# Patient Record
Sex: Male | Born: 1970 | Race: White | Hispanic: No | Smoking: Current every day smoker
Health system: Southern US, Community
[De-identification: ages and names within clinical notes are randomized; demographics above are authoritative.]

## PROBLEM LIST (undated history)

## (undated) DIAGNOSIS — K529 Noninfective gastroenteritis and colitis, unspecified: Secondary | ICD-10-CM

## (undated) DIAGNOSIS — K219 Gastro-esophageal reflux disease without esophagitis: Secondary | ICD-10-CM

## (undated) DIAGNOSIS — I10 Essential (primary) hypertension: Secondary | ICD-10-CM

## (undated) DIAGNOSIS — T7840XA Allergy, unspecified, initial encounter: Secondary | ICD-10-CM

## (undated) DIAGNOSIS — N200 Calculus of kidney: Secondary | ICD-10-CM

## (undated) HISTORY — DX: Calculus of kidney: N20.0

## (undated) HISTORY — PX: COLONOSCOPY: SHX174

## (undated) HISTORY — DX: Essential (primary) hypertension: I10

## (undated) HISTORY — DX: Noninfective gastroenteritis and colitis, unspecified: K52.9

## (undated) HISTORY — DX: Allergy, unspecified, initial encounter: T78.40XA

## (undated) HISTORY — DX: Gastro-esophageal reflux disease without esophagitis: K21.9

---

## 1998-08-16 ENCOUNTER — Emergency Department (HOSPITAL_COMMUNITY): Admission: EM | Admit: 1998-08-16 | Discharge: 1998-08-16 | Payer: Self-pay | Admitting: Emergency Medicine

## 2002-04-11 ENCOUNTER — Encounter: Admission: RE | Admit: 2002-04-11 | Discharge: 2002-04-11 | Payer: Self-pay | Admitting: Internal Medicine

## 2002-04-11 ENCOUNTER — Encounter: Payer: Self-pay | Admitting: Internal Medicine

## 2002-05-16 ENCOUNTER — Encounter: Admission: RE | Admit: 2002-05-16 | Discharge: 2002-05-16 | Payer: Self-pay | Admitting: Gastroenterology

## 2002-05-16 ENCOUNTER — Encounter: Payer: Self-pay | Admitting: Gastroenterology

## 2003-01-10 ENCOUNTER — Encounter: Admission: RE | Admit: 2003-01-10 | Discharge: 2003-01-10 | Payer: Self-pay | Admitting: Geriatric Medicine

## 2003-01-10 ENCOUNTER — Encounter: Payer: Self-pay | Admitting: Geriatric Medicine

## 2006-01-24 ENCOUNTER — Encounter: Admission: RE | Admit: 2006-01-24 | Discharge: 2006-01-24 | Payer: Self-pay | Admitting: Internal Medicine

## 2006-07-26 ENCOUNTER — Emergency Department (HOSPITAL_COMMUNITY): Admission: EM | Admit: 2006-07-26 | Discharge: 2006-07-26 | Payer: Self-pay | Admitting: Emergency Medicine

## 2007-07-07 ENCOUNTER — Encounter: Admission: RE | Admit: 2007-07-07 | Discharge: 2007-07-07 | Payer: Self-pay | Admitting: Geriatric Medicine

## 2011-06-01 ENCOUNTER — Ambulatory Visit
Admission: RE | Admit: 2011-06-01 | Discharge: 2011-06-01 | Disposition: A | Discharge status: Home / Self Care | Payer: BC Managed Care – PPO | Source: Ambulatory Visit | Attending: Internal Medicine | Admitting: Internal Medicine

## 2011-06-01 ENCOUNTER — Other Ambulatory Visit: Payer: Self-pay | Admitting: Internal Medicine

## 2011-06-01 DIAGNOSIS — R1031 Right lower quadrant pain: Secondary | ICD-10-CM

## 2011-06-01 MED ORDER — IOHEXOL 300 MG/ML  SOLN
100.0000 mL | Freq: Once | INTRAMUSCULAR | Status: AC | PRN
  Administered 2011-06-01: 100 mL via INTRAVENOUS

## 2011-06-02 ENCOUNTER — Encounter: Payer: Self-pay | Admitting: Internal Medicine

## 2011-06-29 ENCOUNTER — Encounter: Payer: Self-pay | Admitting: Internal Medicine

## 2011-06-29 ENCOUNTER — Ambulatory Visit (INDEPENDENT_AMBULATORY_CARE_PROVIDER_SITE_OTHER): Payer: BC Managed Care – PPO | Admitting: Internal Medicine

## 2011-06-29 VITALS — BP 120/86 | HR 76 | Ht 67.0 in | Wt 176.0 lb

## 2011-06-29 DIAGNOSIS — R194 Change in bowel habit: Secondary | ICD-10-CM

## 2011-06-29 DIAGNOSIS — J302 Other seasonal allergic rhinitis: Secondary | ICD-10-CM | POA: Insufficient documentation

## 2011-06-29 DIAGNOSIS — R933 Abnormal findings on diagnostic imaging of other parts of digestive tract: Secondary | ICD-10-CM

## 2011-06-29 DIAGNOSIS — Z87442 Personal history of urinary calculi: Secondary | ICD-10-CM | POA: Insufficient documentation

## 2011-06-29 DIAGNOSIS — R198 Other specified symptoms and signs involving the digestive system and abdomen: Secondary | ICD-10-CM

## 2011-06-29 MED ORDER — PEG-KCL-NACL-NASULF-NA ASC-C 100 G PO SOLR: 1.0000 | ORAL | Status: DC

## 2011-06-29 NOTE — Progress Notes (Signed)
Subjective:    Patient ID: Michael Gilmore, male    DOB: 1971/10/03, 40 y.o.   MRN: [id number]  HPI Michael Gilmore is a 40 year old male with little past medical history who is referred by Dr. Perrin Maltese [facility]) for evaluation of possible colitis and abnormal GI imaging. He presents alone today.  The patient reports he was diagnosed with "ulcerative colitis" based on a CT scan from late July along with heme positive stool.  Prior to the last several weeks he has never been previously diagnosed with colitis. He reports that in late June and over a three-week period he was having right groin and right testicular pain. This led him to evaluation in the urgent care clinic, and they are he underwent a CT scan which showed possible focal colitis in the sigmoid colon.  The patient reports during this three-week period of right groin and right testicular pain he was having episodes of diarrhea and diffuse abdominal pain. He also reports nausea and vomiting during that time period.  He reports the abdominal pain was diffuse and did not seem to relate to eating or bowel movement. His diarrhea was occurring 4-5 times per day and he reports never seeing blood or melena. He reports his emesis was intermittent but on one day when he had 3 or 4 violent episodes of vomiting he noticed some scant bright red blood.  This occurred at his last vomiting episode on that day. The vomiting resolved and has not reoccurred.  He also reports "huge" improvement in his abdominal pain, groin pain, testicular pain and nausea and vomiting.  He reports that for the most part his bowel movements are regular but he is still having bouts of diarrhea. He reports the diarrhea can occur with on one day and be gone or last 4-5 days.  He still seen no blood or melena.  He denies rectal pain and tenesmus.  He reports no documented fever, no chills and no night sweats. His appetite has been "as strong as ever" and his weight has been stable to  slightly increased.  He does report a 2-3 year history of right-sided abdominal pain. This is not present currently but has been intermittent through the years.  He reports several years ago he was diagnosed with constipation and used MiraLAX for this. He reports this seemed to significantly help his pain and thus he discontinued the use of laxatives.   Review of Systems Constitutional: Negative for fever, chills, night sweats, activity change, appetite change and unexpected weight change HEENT: Negative for sore throat and trouble swallowing, + intermittent mouth sores (none now). Eyes: Negative for visual disturbance Respiratory: Negative for cough, chest tightness and shortness of breath Cardiovascular: Negative for chest pain, palpitations and lower extremity swelling Gastrointestinal: See history of present illness Genitourinary: Negative for dysuria and hematuria. Musculoskeletal: Negative for back pain, arthralgias and myalgias Skin: Negative for rash or color change Neurological: Negative for headaches, weakness, numbness Hematological: Negative for adenopathy, negative for easy bruising/bleeding Psychiatric/behavioral: Negative for depressed mood, negative for anxiety  PMH 1. History of renal stones 2. Seasonal allergies  Meds: None  All: NKDA   Social History  . Marital Status: Married - 1 step daughter   Occupational History  . [employer]    Social History Main Topics  . Smoking status: Current Everyday Smoker, 1 ppd x 7 yrs  . Alcohol Use: Yes     once weekly  . Drug Use: No   Family History  Problem  Relation Age of Onset  . Hypertension      Paternal uncles  . Hypertension      Maternal uncles  . Thyroid nodules Mother   --neg for IBD, CRC, colon polyps      Objective:   Physical Exam BP 120/86  Pulse 76  Ht 5\' 7"  (1.702 m)  Wt 176 lb (79.833 kg)  BMI 27.57 kg/m2 Constitutional: Well-developed and well-nourished. No distress. HEENT:  Normocephalic and atraumatic. Oropharynx is clear and moist. No oropharyngeal exudate. Conjunctivae are normal. Pupils are equal round and reactive to light. No scleral icterus. Neck: Neck supple. Trachea midline. Cardiovascular: Normal rate, regular rhythm and intact distal pulses. No M/R/G Pulmonary/chest: Effort normal and breath sounds normal. No wheezing, rales or rhonchi. Abdominal: Soft, nontender, nondistended. There are no masses palpable. No hepatosplenomegaly. Lymphadenopathy: No cervical adenopathy noted. Neurological: Alert and oriented to person place and time. Skin: Skin is warm and dry. No rashes noted. Tattoos noted bilateral upper extremities Psychiatric: Normal mood and affect. Behavior is normal.  Labs and imaging obtained from urgent care: 06/01/2011 Sodium 140 potassium 4.5 chloride 104 CO2 21 BUN 12 creatinine 0.97 glucose 95 total bili 0.5 alkaline phosphatase 269 AST 15 ALT 14 total protein 7.2 albumin 4.9 calcium 9.9 lipase 47  CT abdomen and pelvis with contrast (Images reviewed on PACS): Unremarkable liver biliary system spleen pancreas and adrenal glands. Symmetric renal enhancement. No hydronephrosis. There are tiny nonobstructing renal stones. No bowel structure. Normal appendix. Minimal pericolonic fat stranding in the region of the sigmoid colon. No free intraperitoneal air or fluid. No mesenteric or retroperitoneal lymphadenopathy. Normal vasculature.  Impression: There is minimal stranding along a segment of sigmoid colon in the left lower quadrant, which may represent a focal colitis or sequelae of prior bout of colitis.  No CT findings to explain right sided pain.     Assessment & Plan:  This is a 40 year old male with PMH of renal stones referred for evaluation of abnormal GI imaging, heme + stools and abdominal pain with groin and testicular pain which has largely resolved. The patient is still having intermittent loose stools.  1. Abnl imaging/pain/heme +  stools - overall at present the patient's symptoms have improved greatly however he still is having intermittent loose stools. Given the whole presentation the differential includes acute infectious gastroenteritis/colitis and less likely inflammatory bowel disease. However given the abnormal imaging and his recent symptoms colonoscopy is warranted. We'll schedule this for him today.  If his colon is found to be normal, his loose/stools could be secondary to a post infectious IBS-type process. If this is the case I will consider a trial of probiotic.  Further recommendation/followup to be made after colonoscopy.

## 2011-06-29 NOTE — Patient Instructions (Signed)
You have been scheduled for a Colonoscopy. Your prep has been sent to your pharmacy. 

## 2011-07-20 ENCOUNTER — Encounter: Payer: Self-pay | Admitting: Internal Medicine

## 2011-07-20 ENCOUNTER — Ambulatory Visit (AMBULATORY_SURGERY_CENTER): Payer: BC Managed Care – PPO | Admitting: Internal Medicine

## 2011-07-20 ENCOUNTER — Telehealth: Payer: Self-pay | Admitting: *Deleted

## 2011-07-20 ENCOUNTER — Encounter: Payer: Self-pay | Admitting: *Deleted

## 2011-07-20 DIAGNOSIS — R197 Diarrhea, unspecified: Secondary | ICD-10-CM

## 2011-07-20 DIAGNOSIS — R198 Other specified symptoms and signs involving the digestive system and abdomen: Secondary | ICD-10-CM

## 2011-07-20 DIAGNOSIS — R109 Unspecified abdominal pain: Secondary | ICD-10-CM

## 2011-07-20 DIAGNOSIS — R933 Abnormal findings on diagnostic imaging of other parts of digestive tract: Secondary | ICD-10-CM

## 2011-07-20 MED ORDER — SODIUM CHLORIDE 0.9 % IV SOLN: 500.0000 mL | INTRAVENOUS | Status: DC

## 2011-07-20 MED ORDER — PANTOPRAZOLE SODIUM 40 MG PO TBEC: 40.0000 mg | DELAYED_RELEASE_TABLET | Freq: Every day | ORAL | Status: DC

## 2011-07-20 NOTE — Progress Notes (Signed)
From 8:10 until 11:00 [employer] computer system wide down. After 11:00  system on the wireless network still down - downtime procedure policy followed.  Information entered by Laverna Peace RN, procedure room RN Clide Cliff, Recovery room RN Bufford Spikes.

## 2011-07-20 NOTE — Telephone Encounter (Signed)
Message copied by Florene Glen on Tue Jul 20, 2011  3:17 PM ------      Message from: Beverley Fiedler      Created: Tue Jul 20, 2011  2:45 PM       F/U in 4-6 weeks with me in office

## 2011-07-20 NOTE — Telephone Encounter (Signed)
Message copied by Florene Glen on Tue Jul 20, 2011  3:07 PM ------      Message from: Beverley Fiedler      Created: Tue Jul 20, 2011  2:45 PM       F/U in 4-6 weeks with me in office

## 2011-07-20 NOTE — Telephone Encounter (Signed)
Notified pt's wife pt needs a f/u appt with Dr Rhea Belton in 4-6 weeks; I will mail the appt to them. Wife stated understanding.

## 2011-07-20 NOTE — Patient Instructions (Addendum)
DISCHARGE INSTRUCTIONS PER BLUE AND GREEN SHEETS  HANDOUTS GIVEN ON DIVERTICULOSIS AND HIGH FIBER DIET  YOU WILL RECEIVE A LETTER FROM [place] IN 1-2 WEEKS WITH YOUR PATHOLOGY RESULTS AND THIS LETTER WILL HAVE DR PYRTLES RECOMMENDATIONS AND FINDINGS. IF YO HAVE NOT HEARD FROM [place] IN 3 WEEKS, PLEASE CALL THE OFFICE AT [phone].  DR PYRTLE IS GOING TO CALL YOU IN A MEDICINE FOR REFLUX TO YOUR PHARMACY  DR PYRTLES NURSE WILL BE CALLING YOU WITH A FOLLOW UP APPOINTMENT IN THE NEXT DAY OR SO.

## 2011-07-21 ENCOUNTER — Telehealth: Payer: Self-pay

## 2011-07-21 NOTE — Telephone Encounter (Signed)
Follow up Call- Patient questions:  Do you have a fever, pain , or abdominal swelling? no Pain Score  0 *  Have you tolerated food without any problems? yes  Have you been able to return to your normal activities? yes  Do you have any questions about your discharge instructions: Diet   no Medications  no Follow up visit  no  Do you have questions or concerns about your Care? no  Actions: * If pain score is 4 or above: No action needed, pain <4.  "That was some of the best sleep I've had", per the pt. maw

## 2011-08-03 ENCOUNTER — Telehealth: Payer: Self-pay | Admitting: *Deleted

## 2011-08-03 NOTE — Telephone Encounter (Signed)
Message copied by Florene Glen on Tue Aug 03, 2011  2:17 PM ------      Message from: Beverley Fiedler      Created: Mon Aug 02, 2011  8:20 AM       Please call pt with biopsy results (I tried reaching him by phone, but have yet to be successful)      Bx's done for abd pain, change in bowel habits, showed normal colon throughout.  No inflammation.      No polyps.      Therefore, no reason for symptoms, which were improving some when discussing with him at the procedure day.      After procedure, wife mentioned maybe more symptoms than the pt let on, including upper symptoms, such as n/v.      If not improved now and/or upper symptoms persist he may need f/u in clinic or an EGD.      Thanks

## 2011-08-03 NOTE — Telephone Encounter (Signed)
lmom for pt to call back

## 2011-08-06 NOTE — Telephone Encounter (Signed)
Spoke with pt and informed him of Dr Lauro Franklin findings. Offered a f/u vist if his upper abdominal s&s had not improved. Pt stated he has improved with the Pantoprazole, and he has occasional episodes of nausea. He will call for further or worsening problems.

## 2011-08-19 ENCOUNTER — Encounter: Payer: Self-pay | Admitting: Internal Medicine

## 2011-08-24 ENCOUNTER — Other Ambulatory Visit (INDEPENDENT_AMBULATORY_CARE_PROVIDER_SITE_OTHER): Payer: BC Managed Care – PPO

## 2011-08-24 ENCOUNTER — Ambulatory Visit (INDEPENDENT_AMBULATORY_CARE_PROVIDER_SITE_OTHER): Payer: BC Managed Care – PPO | Admitting: Internal Medicine

## 2011-08-24 ENCOUNTER — Encounter: Payer: Self-pay | Admitting: Internal Medicine

## 2011-08-24 VITALS — BP 118/88 | HR 68 | Ht 67.0 in | Wt 183.0 lb

## 2011-08-24 DIAGNOSIS — R195 Other fecal abnormalities: Secondary | ICD-10-CM

## 2011-08-24 DIAGNOSIS — R110 Nausea: Secondary | ICD-10-CM

## 2011-08-24 MED ORDER — LOPERAMIDE HCL 2 MG PO TABS: 2.0000 mg | ORAL_TABLET | ORAL | Status: AC | PRN

## 2011-08-24 MED ORDER — ONDANSETRON HCL 4 MG PO TABS: 4.0000 mg | ORAL_TABLET | Freq: Four times a day (QID) | ORAL | Status: AC | PRN

## 2011-08-24 NOTE — Progress Notes (Signed)
Addended by: Jeanine Luz on: 08/24/2011 11:42 AM   Modules accepted: Orders

## 2011-08-24 NOTE — Progress Notes (Signed)
Addended by: Donata Duff on: 08/24/2011 10:18 AM   Modules accepted: Orders

## 2011-08-24 NOTE — Progress Notes (Signed)
Addended by: Donata Duff on: 08/24/2011 10:57 AM   Modules accepted: Orders

## 2011-08-24 NOTE — Progress Notes (Signed)
Subjective:    Patient ID: Michael Gilmore, male    DOB: 07-06-1971, 40 y.o.   MRN: [id number]  HPI Michael Gilmore is a 40 yo male with little PMH hx who underwent colonoscopy in September 2012, after CT scan revealed possible colitis. He returns today for followup. He is alone. He reports overall he is doing well. His lower abdominal pain has resolved and has not returned. He is still having intermittent nausea, without vomiting. He states this usually occurs early in the morning and is unpredictable. He thinks on days that he eats fast food, then nausea the next morning is more likely. He also states that dairy products seem to cause similar nausea. He does occasionally have epigastric burning, but again this is unpredictable. He is having less frequent heartburn as he is on pantoprazole 40 mg daily. His appetite has remained normal for him and his weight has been stable. He denies early satiety. No dysphagia or odynophagia.  He states the intermittent, unpredictable morning nausea is long-standing for him, and he estimates this has been going on least 5 years.  He also reports intermittent episodes of loose, nonbloody stools. No melena. This also is unpredictable and seems to go along with the early a.m. nausea. On these days he reports 3-4 loose stools early in the morning and then no further stools the rest of that day. Otherwise his stools are formed and brown. No fevers chills or night sweats. No rashes or new joint pains.  Review of Systems Constitutional: Negative for fever, chills, night sweats, activity change, appetite change and unexpected weight change HEENT: Negative for sore throat, mouth sores and trouble swallowing. Eyes: Negative for visual disturbance Respiratory: Negative for cough, chest tightness and shortness of breath Cardiovascular: Negative for chest pain, palpitations and lower extremity swelling Gastrointestinal: See history of present illness Genitourinary: Negative for dysuria  and hematuria. Musculoskeletal: Negative for back pain, arthralgias and myalgias Skin: Negative for rash or color change Neurological: Negative for headaches, weakness, numbness Hematological: Negative for adenopathy, negative for easy bruising/bleeding Psychiatric/behavioral: Negative for depressed mood, negative for anxiety  Patient Active Problem List  Diagnoses  . History of renal stone  . Seasonal allergies   Current Outpatient Prescriptions  Medication Sig Dispense Refill  . loperamide (LOPERAMIDE A-D) 2 MG tablet Take 1 tablet (2 mg total) by mouth as needed for diarrhea/loose stools.  30 tablet  0  . ondansetron (ZOFRAN) 4 MG tablet Take 1 tablet (4 mg total) by mouth every 6 (six) hours as needed for nausea.  30 tablet  3  . pantoprazole (PROTONIX) 40 MG tablet Take 1 tablet (40 mg total) by mouth daily.  30 tablet  1   No Known Allergies   Social History  . Marital Status: Divorced   Occupational History  . [employer]    Social History Main Topics  . Smoking status: Current Everyday Smoker -- 1.0 packs/day    Types: Cigarettes  . Smokeless tobacco: Never Used  . Alcohol Use: 0.6 oz/week    1 Cans of beer per week     once weekly  . Drug Use: No   Other Topics Concern  . None   Social History Narrative  . None   FH: reviewed and no change, neg for celiac disease    Objective:   Physical Exam BP 118/88  Pulse 68  Ht 5\' 7"  (1.702 m)  Wt 183 lb (83.008 kg)  BMI 28.66 kg/m2 Constitutional: Well-developed and well-nourished. No distress. HEENT:  Normocephalic and atraumatic. Oropharynx is clear and moist. No oropharyngeal exudate. Conjunctivae are normal. Pupils are equal round and reactive to light. No scleral icterus. Neck: Neck supple. Trachea midline. Cardiovascular: Normal rate, regular rhythm and intact distal pulses. No M/R/G Pulmonary/chest: Effort normal and breath sounds normal. No wheezing, rales or rhonchi. Abdominal: Soft, nontender,  nondistended. Bowel sounds active throughout. There are no masses palpable. No hepatosplenomegaly. Extremities: no clubbing, cyanosis, or edema Lymphadenopathy: No cervical adenopathy noted. Neurological: Alert and oriented to person place and time. Skin: Skin is warm and dry. No rashes noted. Psychiatric: Normal mood and affect. Behavior is normal.     Assessment & Plan:  40 yo with little PMH here her for follow-up.  Reports intermittent nausea with loose stools.  1. Nausea/loose stools -- the patient's intermittent GI symptoms, namely nausea and loose stools are long-standing in nature. Certainly this could be related to irritable bowel syndrome, I would like to perform blood tests to rule out celiac disease. He also may have lactose intolerance and advised that he avoid lactose containing foods. I also will send an H. pylori serum antibody given his intermittent dyspeptic symptoms. He seems to have responded somewhat favorably to PPI. I've recommended he continue this medication, advised he can move this to 30 minutes to one hour before his last meal of the day, if early a symptoms continue.  I will try him on as needed ondansetron dissolving tabs 4 mg every 6 hours for nausea. Also loperamide 2 mg when necessary loose stools.  I will see him back in 3 months time and if symptoms change, persist, or worsen then we will consider EGD. He is happy with this plan. I've advised that he notify me should symptoms worsen prior to his next visit.

## 2011-08-24 NOTE — Patient Instructions (Addendum)
Please go downstairs to the basement after your appointment to have your labs drawn. Your prescription has been sent to your pharmacy. You may take Lopermide, OTC, 1 after every loose bowel movement, up to 4 a day Continue taking your protonix, either in the morning or before your last meal of the day. Follow up in 3-4 months

## 2011-08-25 ENCOUNTER — Telehealth: Payer: Self-pay | Admitting: *Deleted

## 2011-08-25 NOTE — Telephone Encounter (Signed)
lmom for pt to call back

## 2011-08-25 NOTE — Telephone Encounter (Signed)
Message copied by Florene Glen on Wed Aug 25, 2011  2:54 PM ------      Message from: Beverley Fiedler      Created: Wed Aug 25, 2011  9:21 AM       H pylori ab neg, so no treatment needed.      Awaiting on complete celiac panel results.

## 2011-08-26 NOTE — Telephone Encounter (Signed)
pls let pt know, celiac panel neg.

## 2011-08-27 NOTE — Telephone Encounter (Signed)
Notified pt that his Celiac panel and H.Pylori were negative; pt stated understanding.

## 2011-10-17 ENCOUNTER — Other Ambulatory Visit: Payer: Self-pay | Admitting: Internal Medicine

## 2012-12-30 ENCOUNTER — Other Ambulatory Visit: Payer: Self-pay | Admitting: Internal Medicine

## 2013-08-14 ENCOUNTER — Ambulatory Visit: Payer: BC Managed Care – PPO | Admitting: Internal Medicine

## 2013-08-14 VITALS — BP 120/72 | HR 73 | Temp 98.5°F | Resp 18 | Ht 66.5 in | Wt 184.0 lb

## 2013-08-14 DIAGNOSIS — M546 Pain in thoracic spine: Secondary | ICD-10-CM

## 2013-08-14 DIAGNOSIS — M542 Cervicalgia: Secondary | ICD-10-CM

## 2013-08-14 DIAGNOSIS — R110 Nausea: Secondary | ICD-10-CM

## 2013-08-14 DIAGNOSIS — M545 Low back pain: Secondary | ICD-10-CM

## 2013-08-14 DIAGNOSIS — R197 Diarrhea, unspecified: Secondary | ICD-10-CM

## 2013-08-14 DIAGNOSIS — K219 Gastro-esophageal reflux disease without esophagitis: Secondary | ICD-10-CM

## 2013-08-14 LAB — POCT CBC
Granulocyte percent: 67.2 %G (ref 37–80)
MID (cbc): 0.4 (ref 0–0.9)
MPV: 9.2 fL (ref 0–99.8)
POC Granulocyte: 5.1 (ref 2–6.9)
POC MID %: 5.8 %M (ref 0–12)
Platelet Count, POC: 232 10*3/uL (ref 142–424)
RBC: 4.74 M/uL (ref 4.69–6.13)
RDW, POC: 12.9 %

## 2013-08-14 LAB — COMPREHENSIVE METABOLIC PANEL
ALT: 11 U/L (ref 0–53)
AST: 13 U/L (ref 0–37)
Albumin: 4.5 g/dL (ref 3.5–5.2)
Alkaline Phosphatase: 63 U/L (ref 39–117)
Calcium: 9.7 mg/dL (ref 8.4–10.5)
Chloride: 105 mEq/L (ref 96–112)
Creat: 0.92 mg/dL (ref 0.50–1.35)
Potassium: 4.5 mEq/L (ref 3.5–5.3)

## 2013-08-14 MED ORDER — DICYCLOMINE HCL 20 MG PO TABS: ORAL_TABLET | ORAL | Status: DC

## 2013-08-14 MED ORDER — PANTOPRAZOLE SODIUM 40 MG PO TBEC: DELAYED_RELEASE_TABLET | ORAL | Status: DC

## 2013-08-14 MED ORDER — CYCLOBENZAPRINE HCL 10 MG PO TABS: 10.0000 mg | ORAL_TABLET | Freq: Every day | ORAL | Status: DC

## 2013-08-14 NOTE — Progress Notes (Addendum)
Subjective:  This chart was scribed for Michael Chick, MD by Michael Gilmore, ED Scribe. This patient was seen in room Room/bed info not found and the patient's care was started at 9:36 AM. Actually this note was started by this scribe for me, RPDoolittle, and it soon became obvious that she was not up to the task and she was dismissed!!!!Patrs of the remaining note retain her mistakeds for posterity's sake. The A and P are accurate/ by me. Other areas are edited to provide enough for adequate care.    Patient ID: Michael Gilmore, male    DOB: 06-10-1971, 42 y.o.   MRN: [id number]    Back Pain Pertinent negatives include no abdominal pain, chest pain, fever or headaches.   Chief Complaint  Patient presents with  . upset stomach    diarrhea and vomiting since sunday  . rx refills    protonix   . Back Pain    on and off x20 yrs     HPI Comments: heavily edited Michael Gilmore is a 42 y.o. male who presents to the [facility]  complaining of upset stomach after eating at [employer] which occured 6 days ago. Pt states 2 other people ate the same thing with no problems. Pt reports morning intermittent nausea and vomited this morning. Pt has noticed his stomach is upset when after eating certain foods which has been occurring for a few years. Pt also reports abdominal burning--epigastric/occas/was better on prilosec but ran out 2 mo ago.. Pt reports abdominal pain interferes with his sleeping, causing him to be sleep deprived in the morning.  Pt takes Protonix with relief and ran out 2 months ago. Pt denies weight loss, fever, SOB, palpations, and difficulty urinating.  Pt denies any history of HTN problems. Last time pt visited [facility] they took x rays and notied constipation. He previously had an endoscopy by Dr. Hurdle(Pyrtle) and a colonoscopy which was 2 days ago???_not really--more like 2012 /see notes.  Pt reports lower abdominal pain was relieved by bowel movements. Freq constip.  Pt is  also complaining of lower back pain. Pt states he notices a twinge in his lower back while moving. Pain is radiating to hips and shoulder blades.  Pt states lower back pain was due to stress overtime. Pain radiates to right side of neck and bilateral buttocks.Pt states he has neck pain while turning his neck.  Pt has been to chiropractor with no relief. Pt denies numbness but when trying to grab tools, he noticed weakness. 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 Does nothing else for exercise or flexibility.   Past Medical History  Diagnosis Date  . Colitis   . Asthma   . Kidney stones   . Allergy    No Known Allergies   Current Outpatient Prescriptions on File Prior to Visit  Medication Sig Dispense Refill  . pantoprazole (PROTONIX) 40 MG tablet TAKE 1 TABLET BY MOUTH DAILY.  30 tablet  0   No current facility-administered medications on file prior to visit.      Review of Systems  Constitutional: Negative for fever, chills, activity change, appetite change, fatigue and unexpected weight change.  Respiratory: Negative for cough, chest tightness and shortness of breath.   Cardiovascular: Negative for chest pain and palpitations.  Gastrointestinal: Positive for nausea, vomiting and constipation. Negative for abdominal pain.  Genitourinary: Negative for difficulty urinating.  Musculoskeletal: Positive for back pain and neck pain. Negative for neck  stiffness.  Neurological: Negative for headaches.  Psychiatric/Behavioral: Negative for behavioral problems.       Objective:   Physical Exam  Constitutional: He is oriented to person, place, and time. He appears well-developed and well-nourished. No distress.  HENT:  Head: Normocephalic.  Mouth/Throat: Oropharynx is clear and moist. No oropharyngeal exudate.  Eyes: Conjunctivae and EOM are normal. Pupils are equal, round, and reactive to light.  Neck: Neck supple. No thyromegaly present.  Cardiovascular:  Normal rate, regular rhythm, normal heart sounds and intact distal pulses.   No murmur heard. Pulmonary/Chest: Breath sounds normal.  Abdominal: Soft. Bowel sounds are normal. He exhibits no mass. There is no rebound and no guarding.  No organomegaly mild tenderness in the lower quadrants.   Musculoskeletal: Normal range of motion. He exhibits no edema.  Tenderness in th right paracervical  Tender the left scapular border    No tendness to palpation in the lumbar area  SLR negative at 90 degrees bilaterally.  No sensory or motor in the lower extremities  Lymphadenopathy:    He has no cervical adenopathy.  Neurological: He is alert and oriented to person, place, and time. He has normal reflexes. No cranial nerve deficit.  Skin: No rash noted.  Psychiatric: He has a normal mood and affect. His behavior is normal. Thought content normal.          Assessment & Plan:  Lumbar back pain -PT  Diarrhea - Comprehensive metabolic panel, HELICOBACTER PYLORI  ANTIBODY, IGM  GERD (gastroesophageal reflux disease) - Plan: HELICOBACTER PYLORI  ANTIBODY, IGM/rest protonix  Nausea alone -these 3 suggest IBS/colitis doubtful---trial bentyl  Thoracic back pain - Plan:Ambulatory referral to Physical Therapy//flex hs  Neck pain - Plan: Ambulatory referral to Physical Therapy  Meds ordered this encounter  Medications  . pantoprazole (PROTONIX) 40 MG tablet    Sig: TAKE 1 TABLET BY MOUTH DAILY.    Dispense:  90 tablet    Refill:  1  . dicyclomine (BENTYL) 20 MG tablet    Sig: One ac and hs for 1 week then prn    Dispense:  60 tablet    Refill:  5  . cyclobenzaprine (FLEXERIL) 10 MG tablet    Sig: Take 1 tablet (10 mg total) by mouth at bedtime.    Dispense:  30 tablet    Refill:  0   F/u 65mo

## 2013-08-14 NOTE — Progress Notes (Deleted)
°  Subjective:    Patient ID: Michael Gilmore, male    DOB: 10/24/1971, 42 y.o.   MRN: [id number]  HPI MICHOLAS DRUMWRIGHT is a 42 y.o. male who presents to the [facility] complaining of    Review of Systems     Objective:   Physical Exam        Assessment & Plan:

## 2013-08-21 ENCOUNTER — Encounter: Payer: Self-pay | Admitting: Internal Medicine

## 2015-05-06 ENCOUNTER — Ambulatory Visit (INDEPENDENT_AMBULATORY_CARE_PROVIDER_SITE_OTHER): Payer: BLUE CROSS/BLUE SHIELD | Admitting: Urgent Care

## 2015-05-06 ENCOUNTER — Ambulatory Visit (INDEPENDENT_AMBULATORY_CARE_PROVIDER_SITE_OTHER): Payer: BLUE CROSS/BLUE SHIELD

## 2015-05-06 VITALS — BP 110/70 | HR 74 | Temp 98.4°F | Resp 16 | Ht 67.0 in | Wt 188.0 lb

## 2015-05-06 DIAGNOSIS — R0789 Other chest pain: Secondary | ICD-10-CM

## 2015-05-06 DIAGNOSIS — R062 Wheezing: Secondary | ICD-10-CM

## 2015-05-06 DIAGNOSIS — R05 Cough: Secondary | ICD-10-CM | POA: Diagnosis not present

## 2015-05-06 DIAGNOSIS — Z720 Tobacco use: Secondary | ICD-10-CM | POA: Diagnosis not present

## 2015-05-06 DIAGNOSIS — F17200 Nicotine dependence, unspecified, uncomplicated: Secondary | ICD-10-CM

## 2015-05-06 DIAGNOSIS — R0602 Shortness of breath: Secondary | ICD-10-CM | POA: Diagnosis not present

## 2015-05-06 DIAGNOSIS — R059 Cough, unspecified: Secondary | ICD-10-CM

## 2015-05-06 LAB — COMPREHENSIVE METABOLIC PANEL
ALK PHOS: 56 U/L (ref 39–117)
ALT: 10 U/L (ref 0–53)
AST: 14 U/L (ref 0–37)
Albumin: 4.3 g/dL (ref 3.5–5.2)
BUN: 13 mg/dL (ref 6–23)
CO2: 26 mEq/L (ref 19–32)
CREATININE: 0.95 mg/dL (ref 0.50–1.35)
Calcium: 9.4 mg/dL (ref 8.4–10.5)
Chloride: 103 mEq/L (ref 96–112)
GLUCOSE: 95 mg/dL (ref 70–99)
POTASSIUM: 4.8 meq/L (ref 3.5–5.3)
Sodium: 136 mEq/L (ref 135–145)
Total Bilirubin: 0.5 mg/dL (ref 0.2–1.2)
Total Protein: 6.8 g/dL (ref 6.0–8.3)

## 2015-05-06 LAB — POCT CBC
Granulocyte percent: 72.3 %G (ref 37–80)
HCT, POC: 44.9 % (ref 43.5–53.7)
Hemoglobin: 14.6 g/dL (ref 14.1–18.1)
LYMPH, POC: 2.2 (ref 0.6–3.4)
MCH: 30.3 pg (ref 27–31.2)
MCHC: 32.4 g/dL (ref 31.8–35.4)
MCV: 93.4 fL (ref 80–97)
MID (cbc): 0.6 (ref 0–0.9)
MPV: 6.9 fL (ref 0–99.8)
POC Granulocyte: 7.2 — AB (ref 2–6.9)
POC LYMPH PERCENT: 22.1 %L (ref 10–50)
POC MID %: 5.6 %M (ref 0–12)
Platelet Count, POC: 241 10*3/uL (ref 142–424)
RBC: 4.81 M/uL (ref 4.69–6.13)
RDW, POC: 13.4 %
WBC: 10 10*3/uL (ref 4.6–10.2)

## 2015-05-06 LAB — LIPID PANEL
CHOL/HDL RATIO: 4.3 ratio
CHOLESTEROL: 136 mg/dL (ref 0–200)
HDL: 32 mg/dL — AB (ref >=40)
LDL Cholesterol: 87 mg/dL (ref 0–99)
TRIGLYCERIDES: 85 mg/dL (ref <150)
VLDL: 17 mg/dL (ref 0–40)

## 2015-05-06 MED ORDER — ALBUTEROL SULFATE HFA 108 (90 BASE) MCG/ACT IN AERS: 2.0000 | INHALATION_SPRAY | Freq: Four times a day (QID) | RESPIRATORY_TRACT | Status: DC | PRN

## 2015-05-06 MED ORDER — HYDROCODONE-HOMATROPINE 5-1.5 MG/5ML PO SYRP: 5.0000 mL | ORAL_SOLUTION | Freq: Every evening | ORAL | Status: DC | PRN

## 2015-05-06 MED ORDER — RANITIDINE HCL 150 MG PO TABS: 150.0000 mg | ORAL_TABLET | Freq: Two times a day (BID) | ORAL | Status: DC

## 2015-05-06 MED ORDER — ESOMEPRAZOLE MAGNESIUM 40 MG PO CPDR: 40.0000 mg | DELAYED_RELEASE_CAPSULE | Freq: Every day | ORAL | Status: DC

## 2015-05-06 MED ORDER — GI COCKTAIL ~~LOC~~
30.0000 mL | Freq: Once | ORAL | Status: AC
  Administered 2015-05-06: 30 mL via ORAL

## 2015-05-06 NOTE — Progress Notes (Addendum)
MRN: [id number] DOB: [birth year]  Subjective:   Michael Gilmore is a 44 y.o. male presenting for chief complaint of Chest Pain  Reports ~1 month constant, dull, achy mid-sternal non-radiating chest pain, with intermittent and transient sharp pains over left side of chest. Patient also has had intermittent shob, wheezing, dry cough. He has ~20 pack year history, currently smokes ~1 ppd. For his chest pain, he has tried Tums, Mucinex DM with some relief but no resolution of his symptoms. Denies fevers, diaphoresis, radiation of chest pain to his back, abdomen, neck, jaw or arm; also denies nausea, vomiting, abdominal pain. Denies history of heart disease, high blood pressure, diabetes. Admits family history of heart disease and MI with his uncles maternal side. Of note, patient is interested in quitting smoking, has tried before and ended up quitting "cold-turkey" for ~5 years. Denies any other aggravating or relieving factors, no other questions or concerns.  Michael Gilmore has a current medication list which includes the following prescription(s): dextromethorphan-guaifenesin and ibuprofen. He has No Known Allergies.  Michael Gilmore  has a past medical history of Colitis; Asthma; Kidney stones; and Allergy. Also  has no past surgical history on file.  ROS As in subjective.  Objective:   Vitals: BP 110/70 mmHg  Pulse 74  Temp(Src) 98.4 F (36.9 C) (Oral)  Resp 16  Ht  (1.702 m)  Wt 188 lb (85.276 kg)  BMI 29.44 kg/m2  SpO2 99%  Physical Exam  Constitutional: He is oriented to person, place, and time. He appears well-developed and well-nourished.  HENT:  TM's intact bilaterally, no effusions or erythema. Nares patent, nasal turbinates pink and moist. No sinus tenderness. Oropharynx clear, mucous membranes moist.  Eyes: Conjunctivae are normal. Pupils are equal, round, and reactive to light. Right eye exhibits no discharge. Left eye exhibits no discharge. No scleral icterus.  Neck: Normal  range of motion. Neck supple. No thyromegaly present.  Cardiovascular: Normal rate, regular rhythm and intact distal pulses.  Exam reveals no gallop and no friction rub.   No murmur heard. Pulmonary/Chest: No stridor. No respiratory distress. He has no wheezes. He has no rales. He exhibits no tenderness (no reproducible chest tenderness).  No dullness to percussion.  Abdominal: Soft. Bowel sounds are normal. He exhibits no distension and no mass. There is no tenderness.  Lymphadenopathy:    He has no cervical adenopathy.  Neurological: He is alert and oriented to person, place, and time.  Skin: Skin is warm and dry. No rash noted. No erythema. No pallor.   ECG interpretation by Dr. Patsy Lager and PA-Katlen Seyer: normal sinus rhythm.  [facility] reading (PRIMARY) by  Dr. Clelia Croft and PA-Elian Gloster. Chest: no acute process.  Results for orders placed or performed in visit on 05/06/15 (from the past 24 hour(s))  POCT CBC     Status: Abnormal   Collection Time: 05/06/15 12:41 PM  Result Value Ref Range   WBC 10.0 4.6 - 10.2 K/uL   Lymph, poc 2.2 0.6 - 3.4   POC LYMPH PERCENT 22.1 10 - 50 %L   MID (cbc) 0.6 0 - 0.9   POC MID % 5.6 0 - 12 %M   POC Granulocyte 7.2 (A) 2 - 6.9   Granulocyte percent 72.3 37 - 80 %G   RBC 4.81 4.69 - 6.13 M/uL   Hemoglobin 14.6 14.1 - 18.1 g/dL   HCT, POC 40.9 81.1 - 53.7 %   MCV 93.4 80 - 97 fL   MCH, POC 30.3 27 - 31.2  pg   MCHC 32.4 31.8 - 35.4 g/dL   RDW, POC 16.113.4 %   Platelet Count, POC 241 142 - 424 K/uL   MPV 6.9 0 - 99.8 fL   Assessment and Plan :   1. Atypical chest pain 2. Shortness of breath 3. Wheezing 4. Tobacco use disorder 5. Cough - Labs pending, patient had good response to GI cocktail. This together with reassuring ECG, CBC, CXR, PE - I will start patient on reflux medicine. - Referral to Cardiology pending, would like to have stress testing done given his atypical chest pain and long history of heavy smoking - Follow up by phone in 2-3 days to let me  know he is responding to Nexium, Zantac. If no improvement consider sending patient to ED for more immediate cardiac evaluation. Patient agreed. - Will also provide symptomatic relief for his cough, shob with Hycodan and albuterol inhaler.  Wallis BambergMario Joscelyne Renville, PA-C [facility] [phone] 05/06/2015 11:44 AM

## 2015-05-06 NOTE — Progress Notes (Signed)
I have discussed this pt with Mr. Urban GibsonMani and reviewed the pts EKG and CXR.  His chest pain is atypical and non- acute, and relieved with GI cocktail making cardiac etiology unlikely.  Follow-up with cardiology as an outpt for possible stress test given his history of smoking.  Of note the patient is NOT suspected to have angina as is in the AVS

## 2015-05-06 NOTE — Patient Instructions (Addendum)

## 2015-05-07 ENCOUNTER — Telehealth: Payer: Self-pay

## 2015-05-07 DIAGNOSIS — F17200 Nicotine dependence, unspecified, uncomplicated: Secondary | ICD-10-CM

## 2015-05-07 NOTE — Telephone Encounter (Signed)
Pt states he was told to call Michael Gilmore mani if he wasn't doing better and he is not,he has an appt with Cardiologist tomorrow   Please advise   Best phone for pt is [phone]    Pharmacy [facility] [address]

## 2015-05-08 ENCOUNTER — Ambulatory Visit (INDEPENDENT_AMBULATORY_CARE_PROVIDER_SITE_OTHER): Payer: BLUE CROSS/BLUE SHIELD | Admitting: Internal Medicine

## 2015-05-08 ENCOUNTER — Encounter: Payer: Self-pay | Admitting: Internal Medicine

## 2015-05-08 VITALS — BP 122/78 | HR 69 | Ht 67.0 in | Wt 185.0 lb

## 2015-05-08 DIAGNOSIS — R0789 Other chest pain: Secondary | ICD-10-CM | POA: Diagnosis not present

## 2015-05-08 DIAGNOSIS — Z720 Tobacco use: Secondary | ICD-10-CM

## 2015-05-08 MED ORDER — BUDESONIDE 180 MCG/ACT IN AEPB: 1.0000 | INHALATION_SPRAY | Freq: Two times a day (BID) | RESPIRATORY_TRACT | Status: DC

## 2015-05-08 MED ORDER — CETIRIZINE HCL 10 MG PO TABS: 10.0000 mg | ORAL_TABLET | Freq: Every day | ORAL | Status: DC

## 2015-05-08 MED ORDER — BUPROPION HCL ER (SR) 150 MG PO TB12: 150.0000 mg | ORAL_TABLET | Freq: Two times a day (BID) | ORAL | Status: DC

## 2015-05-08 MED ORDER — VARENICLINE TARTRATE 0.5 MG X 11 & 1 MG X 42 PO MISC: ORAL | Status: DC

## 2015-05-08 MED ORDER — TRIAMCINOLONE ACETONIDE 55 MCG/ACT NA AERO: 2.0000 | INHALATION_SPRAY | Freq: Every day | NASAL | Status: DC

## 2015-05-08 NOTE — Telephone Encounter (Signed)
Patient saw cardiologist and no stress test was done. Per patient, the cardiologist told him that his atypical chest pain was more related to his smoking and lungs. He was started on inhalers and given a rx for Chantix which he is not interested in starting. He would like to try Wellbutrin, potential for adverse effects were reviewed. Follow up in 4 weeks.

## 2015-05-08 NOTE — Telephone Encounter (Signed)
LMOM to see if patient was able to see cardiologist and review labs. Requested call back.   If patient calls back, you can tell patient that his electrolytes, blood sugar, liver enzymes and kidney function were normal. His good cholesterol (HDL) was slightly low but the rest of his cholesterol was fine. He can start an Omega 3 fatty acid or fish oil supplement. He can also eat healthier, less fatty foods.  Please ask for a good time to call him as well. So I can follow up with his chest pain.  Thank you!

## 2015-05-08 NOTE — Progress Notes (Signed)
Cardiology Office Note   Date:  05/08/2015   ID:  Michael Gilmore, DOB January 14, 1971, MRN [id number]  PCP:  No PCP Per Patient  Cardiologist:   Dietrich Pates, MD   No chief complaint on file.  Pt referred for chest pain.     History of Present Illness: Michael Gilmore is a 44 y.o. male with a history of chest tightness Coughing a lot  Occurs about one month Has sharp pain substernal and lateral  Lasts about a second  Usually wtthout activity. Chest pressure can last  most of day  Not terrible A little SOB  No alleviating or aggrevating factors  NOthing with yard work makes worse  GERD does not seem to be a problem now.  Has been wheezing  Sore throat at end of day       Current Outpatient Prescriptions  Medication Sig Dispense Refill  . Acetaminophen (TYLENOL PO) Take 2 tablets by mouth as needed (headache).    Marland Kitchen albuterol (PROVENTIL HFA;VENTOLIN HFA) 108 (90 BASE) MCG/ACT inhaler Inhale 2 puffs into the lungs every 6 (six) hours as needed for wheezing or shortness of breath (cough, shortness of breath or wheezing.). 1 Inhaler 1  . esomeprazole (NEXIUM) 40 MG capsule Take 1 capsule (40 mg total) by mouth daily. 30 capsule 3  . HYDROcodone-homatropine (HYCODAN) 5-1.5 MG/5ML syrup Take 5 mLs by mouth at bedtime as needed. 120 mL 0  . ranitidine (ZANTAC) 150 MG tablet Take 1 tablet (150 mg total) by mouth 2 (two) times daily. 30 tablet 0   No current facility-administered medications for this visit.    Allergies:   Review of patient's allergies indicates no known allergies.   Past Medical History  Diagnosis Date  . Colitis   . Asthma   . Kidney stones   . Allergy     No past surgical history on file.   Social History:  The patient  reports that he has been smoking Cigarettes.  He has been smoking about 1.00 pack per day. He has never used smokeless tobacco. He reports that he drinks about 0.6 oz of alcohol per week. He reports that he does not use illicit drugs.   Family  History:  The patient's family history includes Hypertension in an other family member; Thyroid nodules in his mother.    ROS:  Please see the history of present illness. All other systems are reviewed and  Negative to the above problem except as noted.    PHYSICAL EXAM: VS:  BP 122/78 mmHg  Pulse 69  Ht  (1.702 m)  Wt 185 lb (83.915 kg)  BMI 28.97 kg/m2  SpO2 99%  GEN: Well nourished, well developed, in no acute distress HEENT: normal Neck: no JVD, carotid bruits, or masses Cardiac: RRR; no murmurs, rubs, or gallops,no edema  Respiratory:  Mild wheezing on exam  Some decreased airflow  GI: soft, nontender, nondistended, + BS  No hepatomegaly  MS: no deformity Moving all extremities   Skin: warm and dry, no rash Neuro:  Strength and sensation are intact Psych: euthymic mood, full affect   EKG:  EKG is ordered today.  EKG 7/6  NSR     Lipid Panel    Component Value Date/Time   CHOL 136 05/06/2015 1234   TRIG 85 05/06/2015 1234   HDL 32* 05/06/2015 1234   CHOLHDL 4.3 05/06/2015 1234   VLDL 17 05/06/2015 1234   LDLCALC 87 05/06/2015 1234      Wt Readings  from Last 3 Encounters:  05/08/15 185 lb (83.915 kg)  05/06/15 188 lb (85.276 kg)  08/14/13 184 lb (83.462 kg)      ASSESSMENT AND PLAN:  1.  Chest pressure / chest pain  Atypical  I do not think cardiac in origin  He is wheezing on exam with some decreased airflow I would recomm pulmicort inhaler 2x per day; Zyrtec or Claritin, Nasocort, I would not sched cardiac testing  If does not improve consider PFTs  Defer to primary  2.  Tob abuse  Needs to quit esp with problem 1  Will give Rx for Chantix.    No definite f/u scheduled     Signed, Dietrich PatesPaula Valera Vallas, MD  05/08/2015 12:32 PM    [facility] [address], [place], [place]  [address] Phone: [phone]; Fax: [phone]

## 2015-05-08 NOTE — Patient Instructions (Signed)
Your physician has recommended you make the following change in your medication:  1.) ZYRTEC 10 MG ONCE DAILY 2.) NASACORT AS DIRECTED 3.) PULMICORT 1 PUFF TWICE DAILY 4.) CHANTIX--AS DIRECTED.  Your physician recommends that you schedule a follow-up appointment WITH [facility].

## 2016-03-12 IMAGING — CR DG CHEST 2V
2 series · 2 of 2 positions shown · non-contrast
Comparison: None.

CLINICAL DATA: One month history of chest pain and nonproductive
cough with shortness of breath and wheezing. , current smoker

EXAM:
CHEST  2 VIEW

[PA]
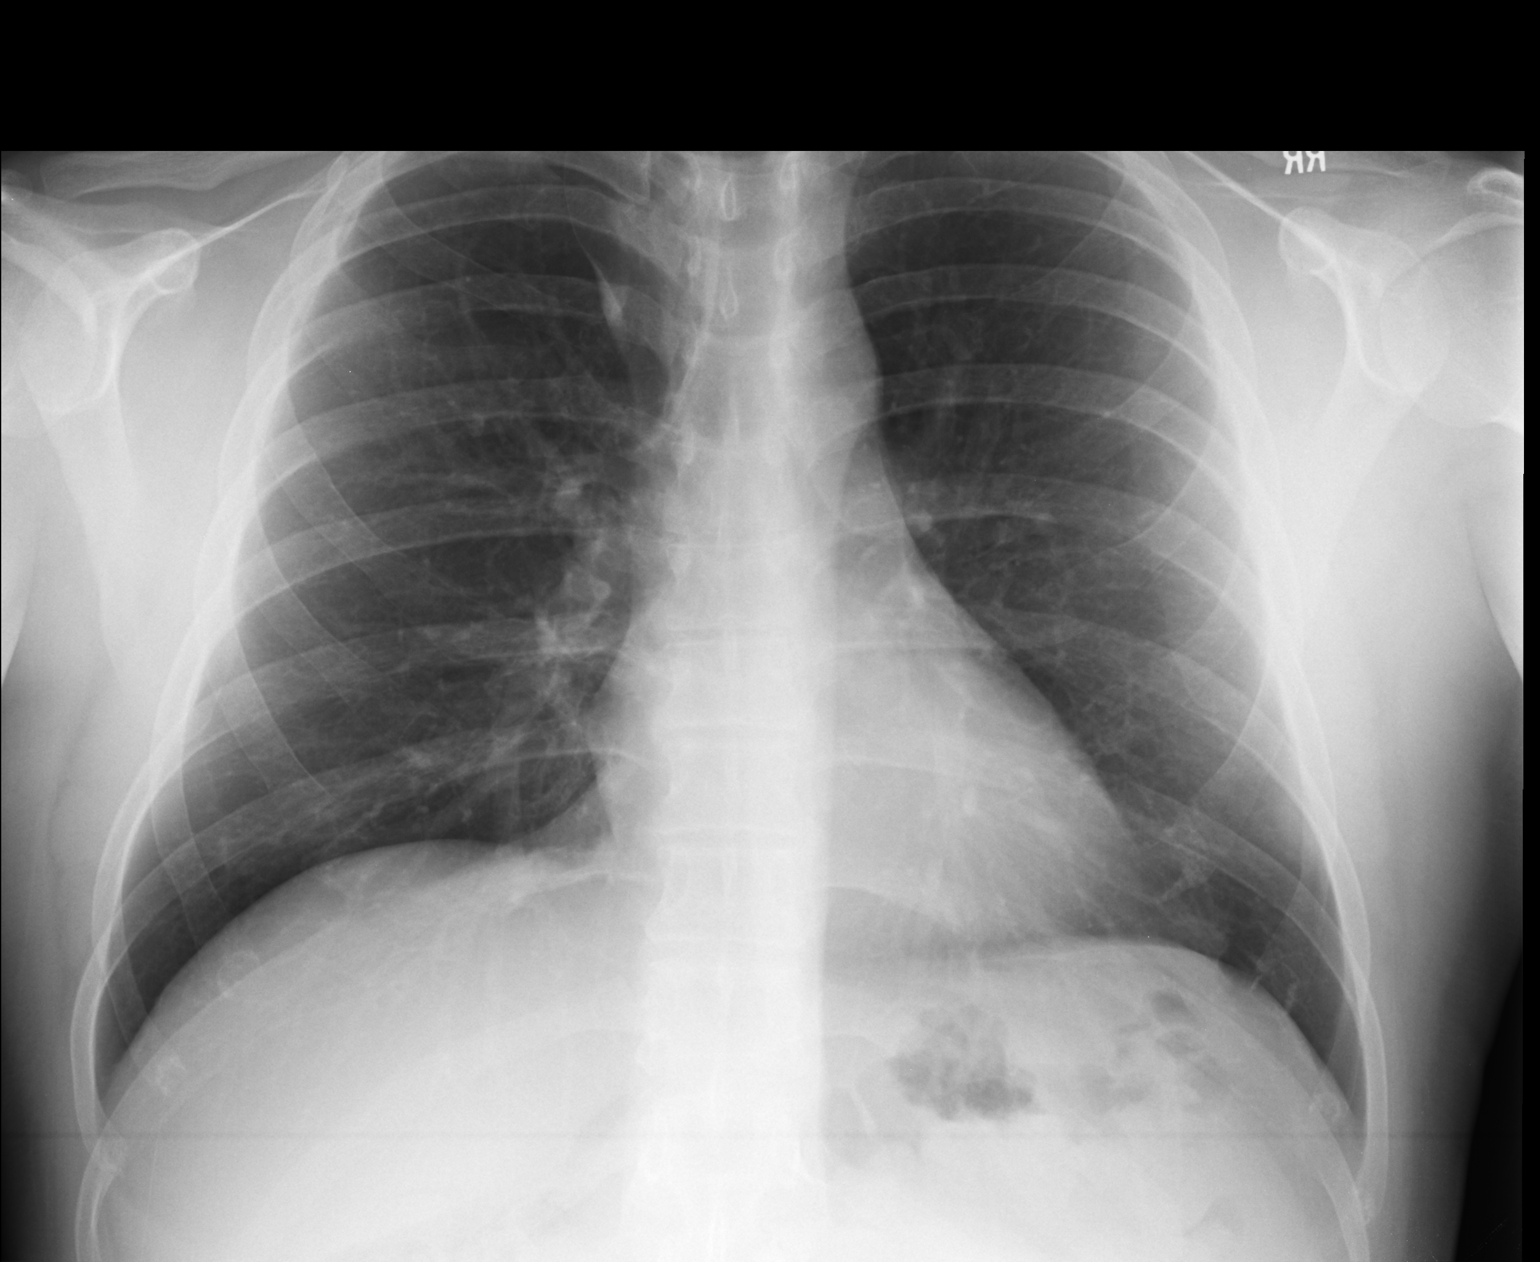

[lateral]
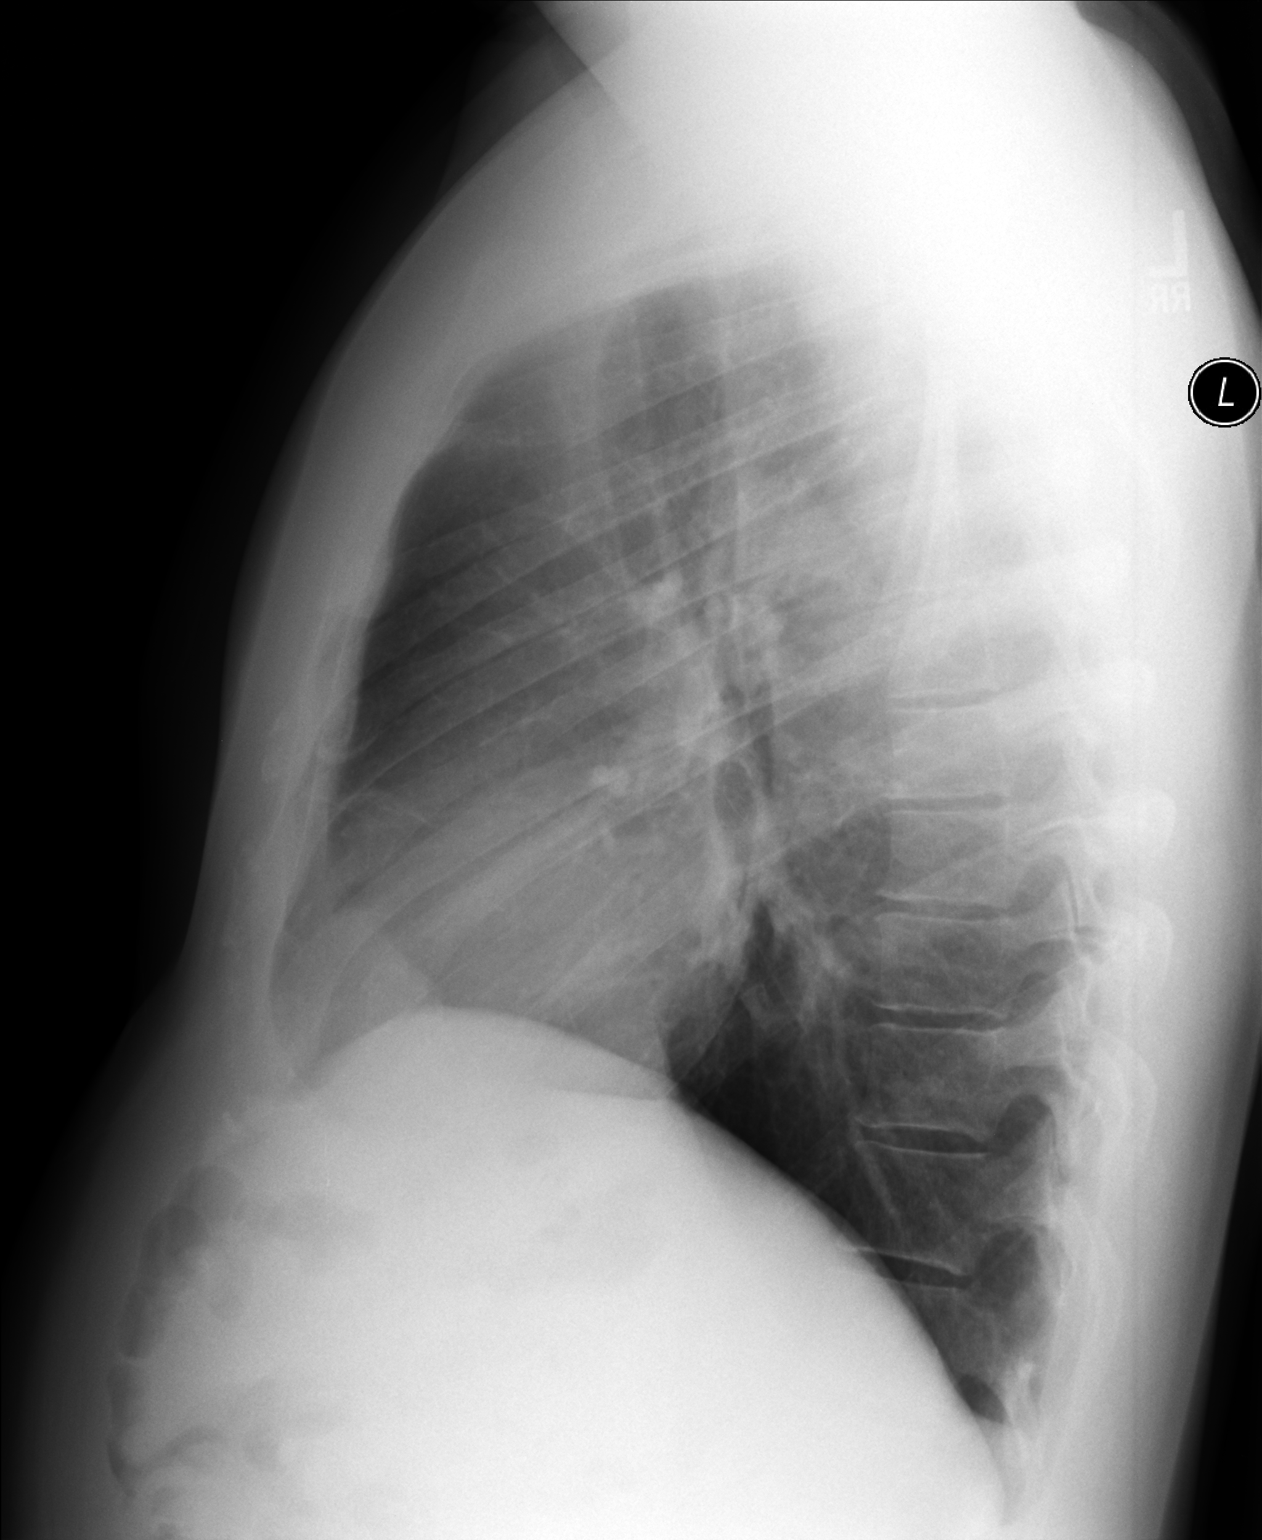

[2 of 2 positions shown; findings below may reference images not displayed]

FINDINGS: The lungs are adequately inflated. There is an azygos lobe anatomy
on the right. There is no pneumonia nor pleural effusion. The heart
and pulmonary vascularity are normal. The mediastinum is normal in
width. The bony thorax is unremarkable.
IMPRESSION: There is no active cardiopulmonary disease.

## 2016-05-28 ENCOUNTER — Ambulatory Visit (INDEPENDENT_AMBULATORY_CARE_PROVIDER_SITE_OTHER): Payer: BLUE CROSS/BLUE SHIELD | Admitting: Physician Assistant

## 2016-05-28 ENCOUNTER — Ambulatory Visit (INDEPENDENT_AMBULATORY_CARE_PROVIDER_SITE_OTHER): Payer: BLUE CROSS/BLUE SHIELD

## 2016-05-28 VITALS — BP 130/82 | HR 70 | Temp 97.5°F | Resp 16 | Ht 67.0 in | Wt 179.0 lb

## 2016-05-28 DIAGNOSIS — R109 Unspecified abdominal pain: Secondary | ICD-10-CM

## 2016-05-28 DIAGNOSIS — N200 Calculus of kidney: Secondary | ICD-10-CM | POA: Diagnosis not present

## 2016-05-28 DIAGNOSIS — R319 Hematuria, unspecified: Secondary | ICD-10-CM | POA: Diagnosis not present

## 2016-05-28 DIAGNOSIS — D72829 Elevated white blood cell count, unspecified: Secondary | ICD-10-CM | POA: Diagnosis not present

## 2016-05-28 LAB — POCT URINALYSIS DIP (MANUAL ENTRY)
Bilirubin, UA: NEGATIVE
Glucose, UA: NEGATIVE
Ketones, POC UA: NEGATIVE
Leukocytes, UA: NEGATIVE
Nitrite, UA: NEGATIVE
PH UA: 5.5
UROBILINOGEN UA: 0.2

## 2016-05-28 LAB — POCT CBC
Granulocyte percent: 78 %G (ref 37–80)
HCT, POC: 45.2 % (ref 43.5–53.7)
Hemoglobin: 16.2 g/dL (ref 14.1–18.1)
LYMPH, POC: 2.8 (ref 0.6–3.4)
MCH, POC: 32.7 pg — AB (ref 27–31.2)
MCHC: 36 g/dL — AB (ref 31.8–35.4)
MCV: 90.9 fL (ref 80–97)
MID (cbc): 0.3 (ref 0–0.9)
MPV: 7.8 fL (ref 0–99.8)
POC Granulocyte: 11 — AB (ref 2–6.9)
POC LYMPH PERCENT: 20.1 %L (ref 10–50)
POC MID %: 1.9 % (ref 0–12)
Platelet Count, POC: 174 10*3/uL (ref 142–424)
RBC: 4.97 M/uL (ref 4.69–6.13)
RDW, POC: 13.2 %
WBC: 14.1 10*3/uL — AB (ref 4.6–10.2)

## 2016-05-28 MED ORDER — IBUPROFEN 800 MG PO TABS: 400.0000 mg | ORAL_TABLET | Freq: Three times a day (TID) | ORAL | 0 refills | Status: AC

## 2016-05-28 MED ORDER — HYDROCODONE-ACETAMINOPHEN 10-325 MG PO TABS: 0.5000 | ORAL_TABLET | Freq: Three times a day (TID) | ORAL | 0 refills | Status: DC | PRN

## 2016-05-28 MED ORDER — KETOROLAC TROMETHAMINE 60 MG/2ML IM SOLN
60.0000 mg | Freq: Once | INTRAMUSCULAR | Status: AC
  Administered 2016-05-28: 60 mg via INTRAMUSCULAR

## 2016-05-28 MED ORDER — TAMSULOSIN HCL 0.4 MG PO CAPS: 0.4000 mg | ORAL_CAPSULE | Freq: Every day | ORAL | 0 refills | Status: DC

## 2016-05-28 NOTE — Progress Notes (Signed)
05/28/2016 12:00 PM   DOB: [birth year] / MRN: [id number]  SUBJECTIVE:  Michael Gilmore is a 45 y.o. male presenting for flank pain and hematuria that started 12 hours ago. States the flank pain radiates to his anterior right mid abdomen and he associates nausea and emesis.  He has tried drinking copious fluids however he was unable to hold this down.  He has a history of nephrolithiasis confirmed on CT scan in 2012.    He has No Known Allergies.   He  has a past medical history of Allergy; Asthma; Colitis; and Kidney stones.    He  reports that he has been smoking Cigarettes.  He has been smoking about 1.00 pack per day. He has never used smokeless tobacco. He reports that he drinks about 0.6 oz of alcohol per week . He reports that he does not use drugs. He  has no sexual activity history on file. The patient  has no past surgical history on file.  His family history includes Thyroid nodules in his mother.  Review of Systems  Constitutional: Positive for chills. Negative for diaphoresis, fever and malaise/fatigue.  Respiratory: Negative for cough.   Cardiovascular: Negative for chest pain.  Gastrointestinal: Positive for nausea and vomiting. Negative for constipation and diarrhea.  Genitourinary: Positive for flank pain and hematuria.  Musculoskeletal: Negative for myalgias.  Skin: Negative for itching and rash.  Neurological: Negative for dizziness, weakness and headaches.  Psychiatric/Behavioral: Negative for depression.    Problem list and medications reviewed and updated by myself where necessary, and exist elsewhere in the encounter.   OBJECTIVE:  BP 130/82 (BP Location: Right Arm, Patient Position: Sitting)   Pulse 70   Temp 97.5 F (36.4 C) (Oral)   Resp 16   Ht 5\' 7"  (1.702 m)   Wt 179 lb (81.2 kg)   SpO2 100%   BMI 28.04 kg/m   Physical Exam  Constitutional: He is oriented to person, place, and time. He appears well-developed. He does not appear ill.  Eyes:  Conjunctivae and EOM are normal. Pupils are equal, round, and reactive to light.  Cardiovascular: Normal rate.   Pulmonary/Chest: Effort normal.  Abdominal: He exhibits no distension. There is no hepatosplenomegaly. There is tenderness. There is CVA tenderness (right side). There is no rigidity, no rebound, no guarding, no tenderness at McBurney's point and negative Murphy's sign.  Musculoskeletal: Normal range of motion.  Neurological: He is alert and oriented to person, place, and time. No cranial nerve deficit. Coordination normal.  Skin: Skin is warm and dry. He is not diaphoretic.  Psychiatric: He has a normal mood and affect.  Nursing note and vitals reviewed.   Results for orders placed or performed in visit on 05/28/16 (from the past 72 hour(s))  POCT urinalysis dipstick     Status: Abnormal   Collection Time: 05/28/16 10:45 AM  Result Value Ref Range   Color, UA yellow yellow   Clarity, UA clear clear   Glucose, UA negative negative   Bilirubin, UA negative negative   Ketones, POC UA negative negative   Spec Grav, UA <=1.005    Blood, UA large (A) negative   pH, UA 5.5    Protein Ur, POC trace (A) negative   Urobilinogen, UA 0.2    Nitrite, UA Negative Negative   Leukocytes, UA Negative Negative  POCT CBC     Status: Abnormal   Collection Time: 05/28/16 11:30 AM  Result Value Ref Range   WBC 14.1 (A) 4.6 -  10.2 K/uL   Lymph, poc 2.8 0.6 - 3.4   POC LYMPH PERCENT 20.1 10 - 50 %L   MID (cbc) 0.3 0 - 0.9   POC MID % 1.9 0 - 12 %M   POC Granulocyte 11.0 (A) 2 - 6.9   Granulocyte percent 78.0 37 - 80 %G   RBC 4.97 4.69 - 6.13 M/uL   Hemoglobin 16.2 14.1 - 18.1 g/dL   HCT, POC 82.9 56.2 - 53.7 %   MCV 90.9 80 - 97 fL   MCH, POC 32.7 (A) 27 - 31.2 pg   MCHC 36.0 (A) 31.8 - 35.4 g/dL   RDW, POC 13.0 %   Platelet Count, POC 174 142 - 424 K/uL   MPV 7.8 0 - 99.8 fL    Dg Abd 2 Views  Result Date: 05/28/2016 CLINICAL DATA:  Hematuria. EXAM: ABDOMEN - 2 VIEW COMPARISON:   CT 06/01/2011 . FINDINGS: Soft tissue structures are unremarkable. Stable pelvic calcifications noted consistent with phleboliths. Tiny renal calyceal stones. IMPRESSION: Tiny bilateral renal calyceal stones.  Exam otherwise unremarkable. Electronically Signed   By: Maisie Fus  Register   On: 05/28/2016 11:42   ASSESSMENT AND PLAN  Trystian was seen today for other.  Diagnoses and all orders for this visit:  Nephrolithiasis: Granulocytosis 2/2 demargination most likely.  Will culture urine to be sure.  I have written for NSAIDS, flomax, and a short course of opioids.  He will call if he is not improving and will either CT if his symptoms have changed or if they have not changing and he is simply not improving will send to urology.  -     tamsulosin (FLOMAX) 0.4 MG CAPS capsule; Take 1 capsule (0.4 mg total) by mouth daily.  Right flank pain -     POCT CBC -     DG Abd 2 Views; Future -     ketorolac (TORADOL) injection 60 mg; Inject 2 mLs (60 mg total) into the muscle once. -     HYDROcodone-acetaminophen (NORCO) 10-325 MG tablet; Take 0.5-1 tablets by mouth every 8 (eight) hours as needed for severe pain. -     ibuprofen (ADVIL,MOTRIN) 800 MG tablet; Take 0.5-1 tablets (400-800 mg total) by mouth 3 (three) times daily. Take with food.  Hematuria -     POCT urinalysis dipstick    The patient was advised to call or return to clinic if he does not see an improvement in symptoms, or to seek the care of the closest emergency department if he worsens with the above plan.   Deliah Boston, MHS, PA-C [facility] 05/28/2016 12:00 PM

## 2016-05-28 NOTE — Patient Instructions (Signed)
     IF you received an x-ray today, you will receive an invoice from [facility]. Please contact [facility] at [phone] with questions or concerns regarding your invoice.   IF you received labwork today, you will receive an invoice from [facility]. Please contact [facility] at [phone] with questions or concerns regarding your invoice.   Our billing staff will not be able to assist you with questions regarding bills from these companies.  You will be contacted with the lab results as soon as they are available. The fastest way to get your results is to activate your My Chart account. Instructions are located on the last page of this paperwork. If you have not heard from us regarding the results in 2 weeks, please contact this office.      

## 2016-05-30 LAB — URINE CULTURE: Organism ID, Bacteria: NO GROWTH

## 2017-04-04 IMAGING — DX DG ABDOMEN 2V
2 series · 3 of 3 positions shown · non-contrast
Comparison: CT 06/01/2011 .

CLINICAL DATA: Hematuria.

EXAM:
ABDOMEN - 2 VIEW

[abdomen erect]
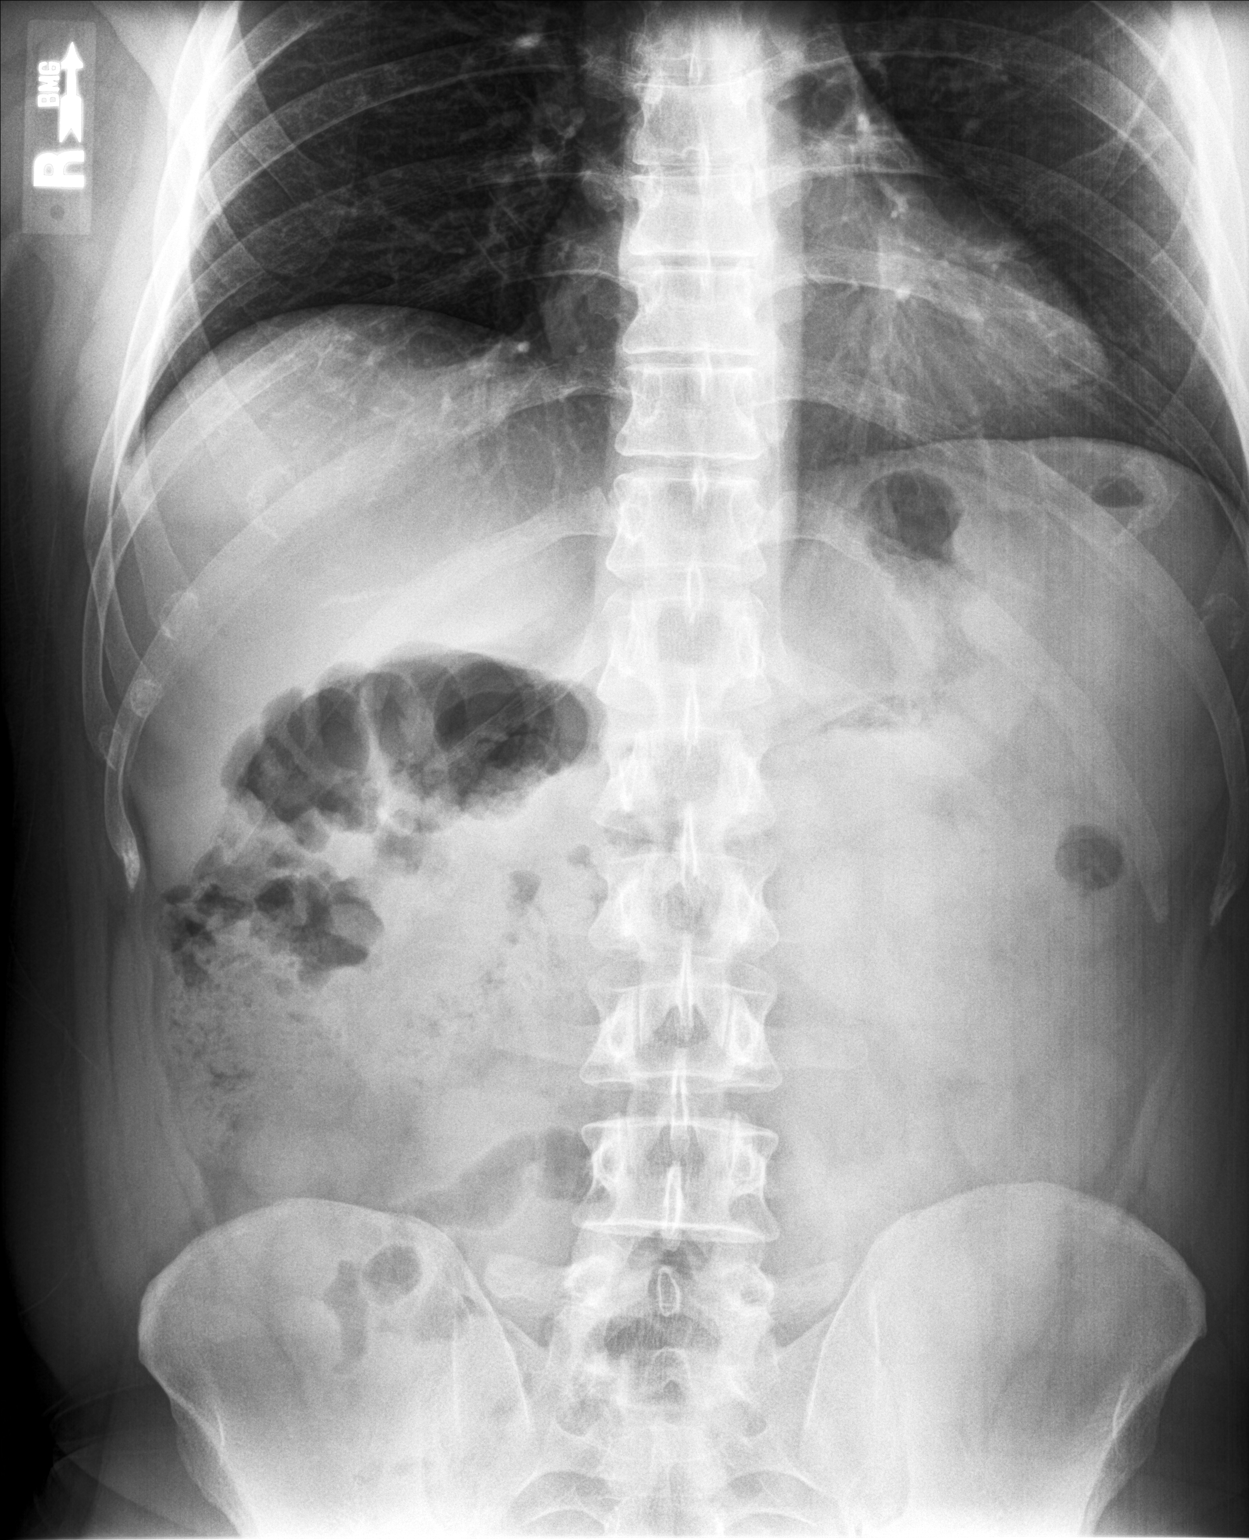

[Series 2: abdomen supine · 0.14mm/px · 2 of 2 slices shown]
[im 1/2]
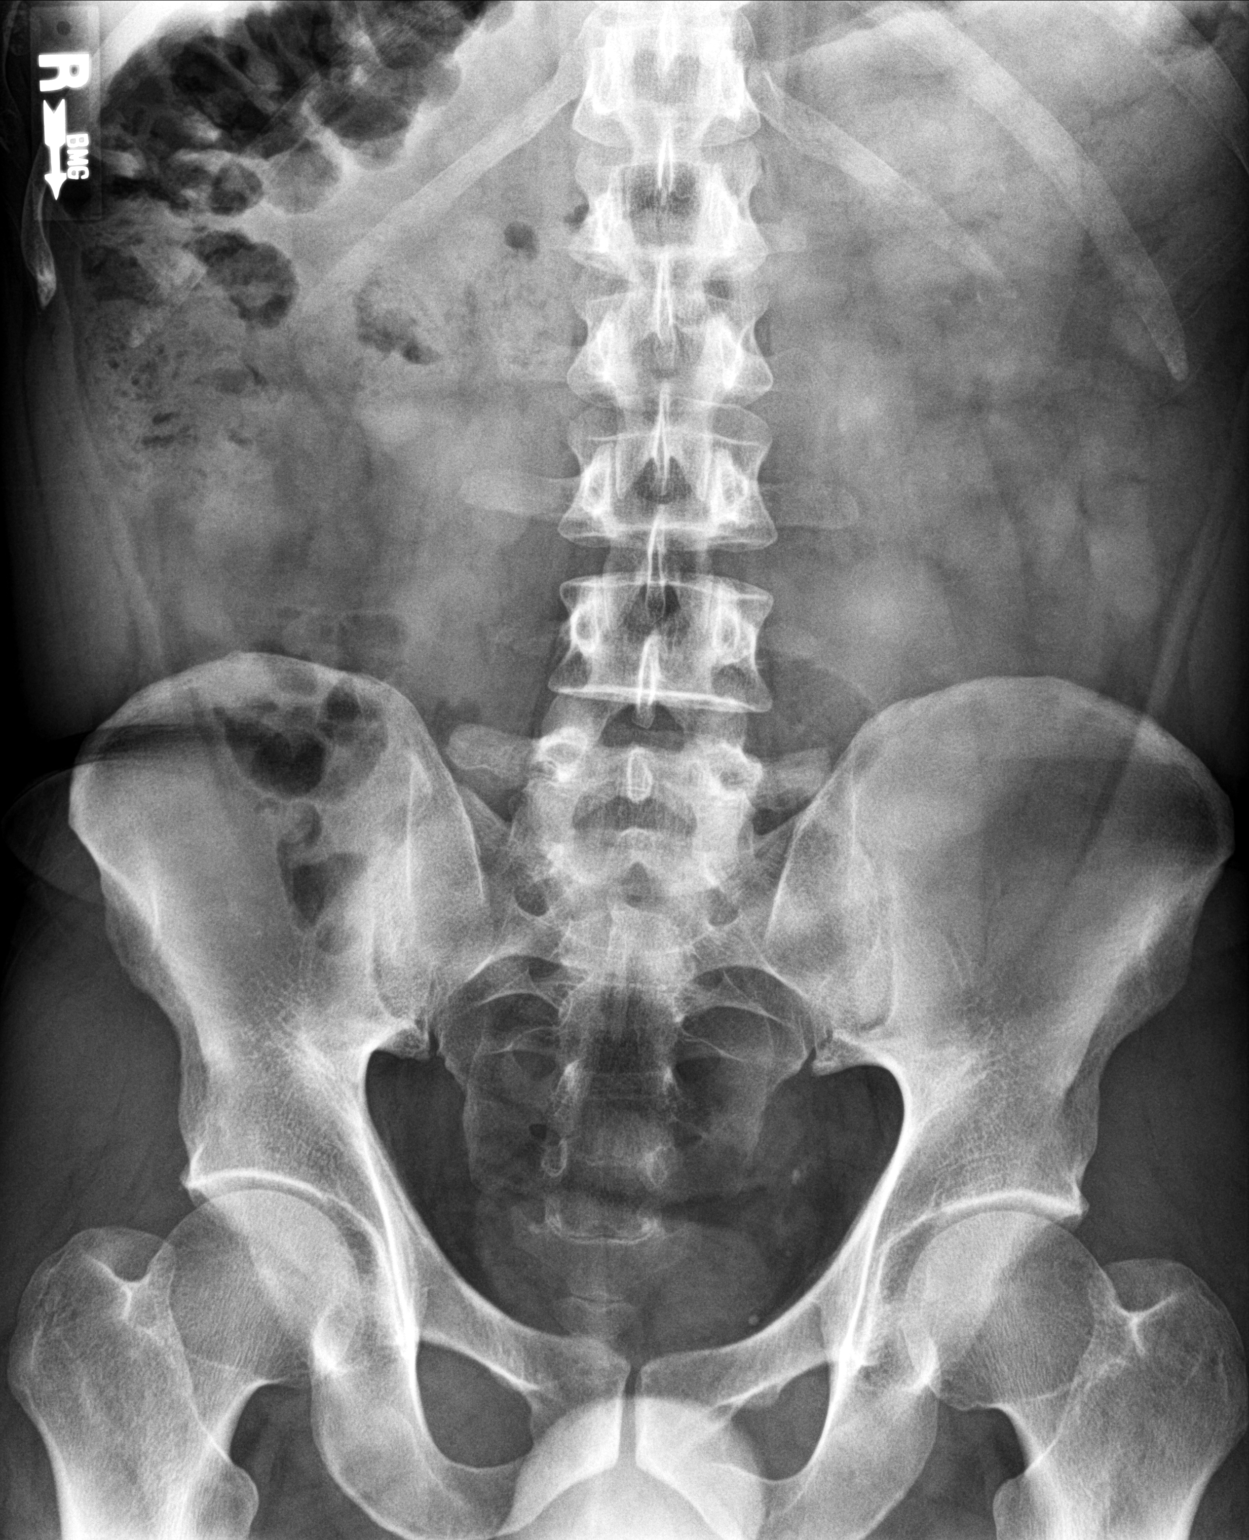
[im 2/2]
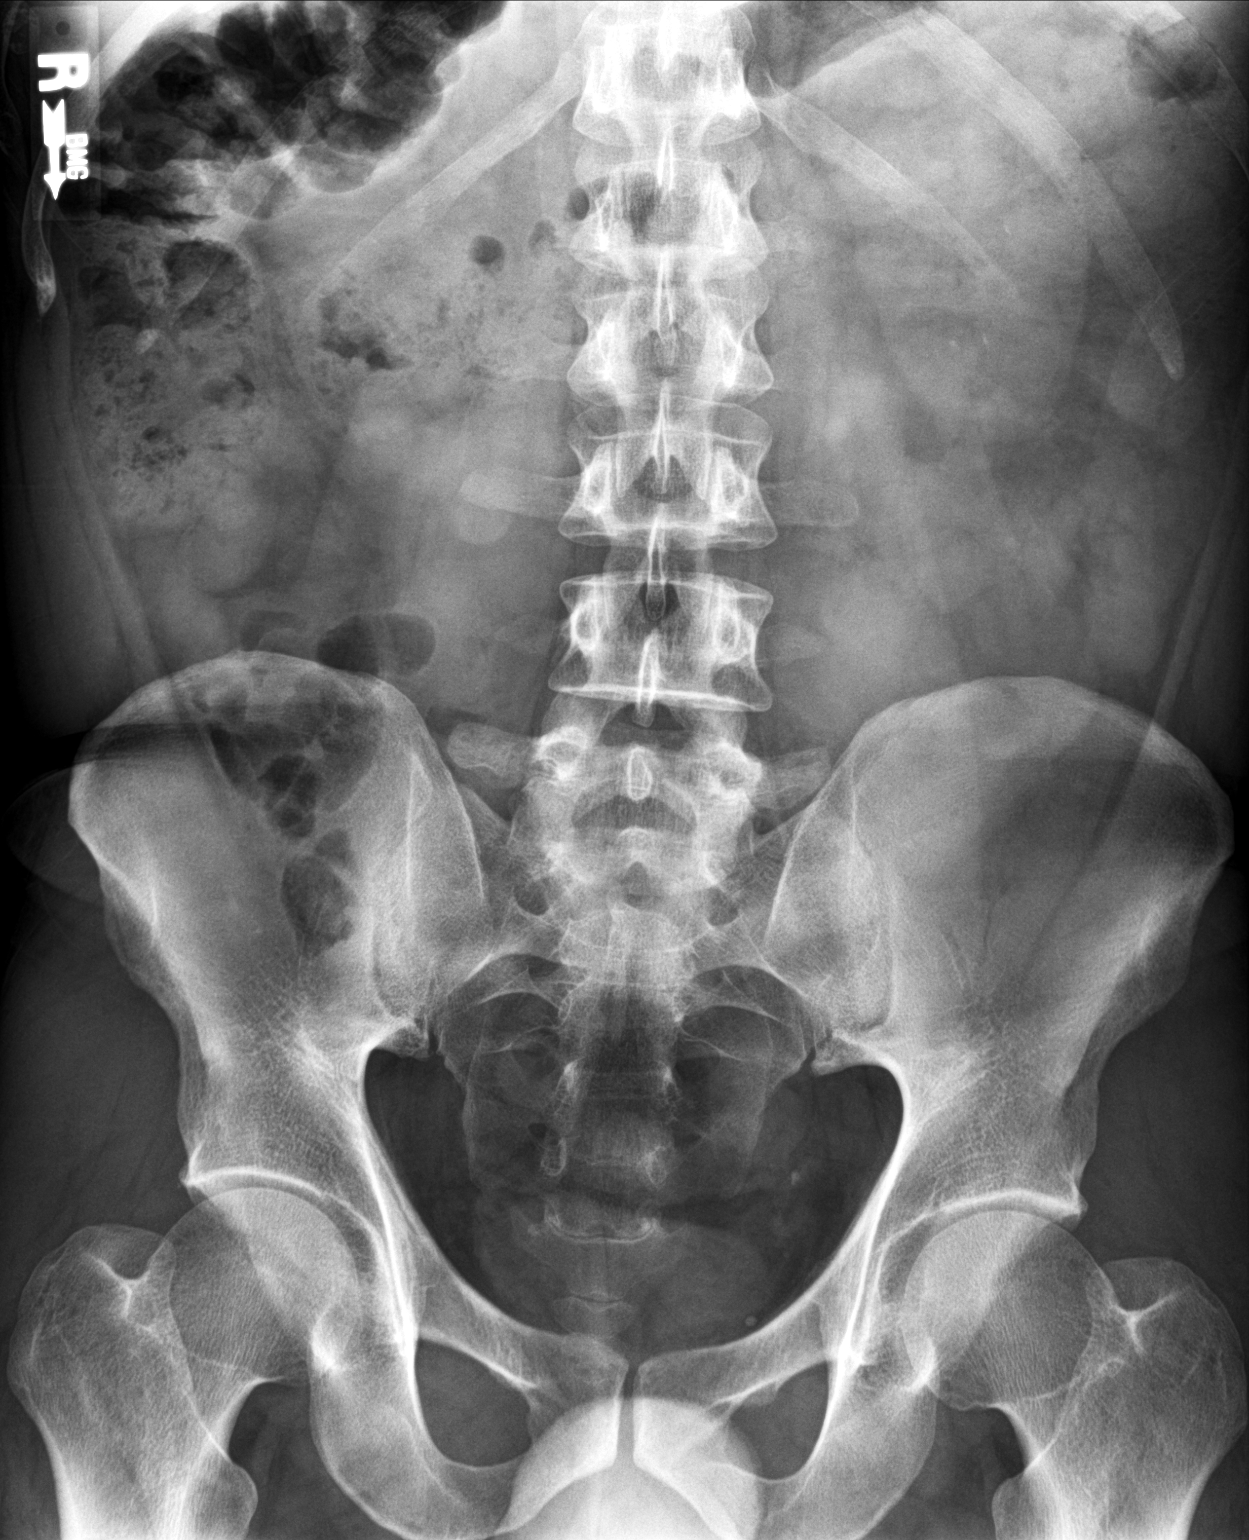

[3 of 3 positions shown; findings below may reference images not displayed]

FINDINGS: Soft tissue structures are unremarkable. Stable pelvic
calcifications noted consistent with phleboliths. Tiny renal
calyceal stones.
IMPRESSION: Tiny bilateral renal calyceal stones.  Exam otherwise unremarkable.

## 2020-01-18 ENCOUNTER — Ambulatory Visit: Payer: Managed Care, Other (non HMO) | Attending: Internal Medicine

## 2020-01-18 DIAGNOSIS — Z23 Encounter for immunization: Secondary | ICD-10-CM

## 2020-01-18 NOTE — Progress Notes (Signed)
   Covid-19 Vaccination Clinic  Name:  Michael Gilmore    MRN: [id number] DOB: Jul 09, 1971  01/18/2020  [name] was observed post Covid-19 immunization for 15 minutes without incident. He was provided with Vaccine Information Sheet and instruction to access the V-Safe system.   [name] was instructed to call 911 with any severe reactions post vaccine: Marland Kitchen Difficulty breathing  . Swelling of face and throat  . A fast heartbeat  . A bad rash all over body  . Dizziness and weakness   Immunizations Administered    Name Date Dose VIS Date Route   Pfizer COVID-19 Vaccine 01/18/2020  2:44 PM 0.3 mL 10/12/2019 Intramuscular   Manufacturer: [employer], [employer]   Lot: [id number]   NDC: [address]-1

## 2020-02-13 ENCOUNTER — Ambulatory Visit: Payer: Managed Care, Other (non HMO) | Attending: Internal Medicine

## 2020-02-13 DIAGNOSIS — Z23 Encounter for immunization: Secondary | ICD-10-CM

## 2020-02-13 NOTE — Progress Notes (Signed)
   Covid-19 Vaccination Clinic  Name:  JAMAEL HOFFMANN    MRN: [id number] DOB: 1971/01/25  02/13/2020  Mr. Vandermeulen was observed post Covid-19 immunization for 15 minutes without incident. He was provided with Vaccine Information Sheet and instruction to access the V-Safe system.   Mr. Febo was instructed to call 911 with any severe reactions post vaccine: Marland Kitchen Difficulty breathing  . Swelling of face and throat  . A fast heartbeat  . A bad rash all over body  . Dizziness and weakness   Immunizations Administered    Name Date Dose VIS Date Route   Pfizer COVID-19 Vaccine 02/13/2020  8:43 AM 0.3 mL 10/12/2019 Intramuscular   Manufacturer: [employer], [employer]   Lot: [id number]   NDC: [address]-5

## 2020-05-02 ENCOUNTER — Other Ambulatory Visit: Payer: Self-pay | Admitting: Internal Medicine

## 2020-05-02 DIAGNOSIS — N50811 Right testicular pain: Secondary | ICD-10-CM

## 2020-05-14 ENCOUNTER — Other Ambulatory Visit: Payer: Managed Care, Other (non HMO)

## 2020-11-13 DIAGNOSIS — Z20822 Contact with and (suspected) exposure to covid-19: Secondary | ICD-10-CM | POA: Diagnosis not present

## 2021-03-18 DIAGNOSIS — M9901 Segmental and somatic dysfunction of cervical region: Secondary | ICD-10-CM | POA: Diagnosis not present

## 2021-03-18 DIAGNOSIS — I10 Essential (primary) hypertension: Secondary | ICD-10-CM | POA: Diagnosis not present

## 2021-10-01 DIAGNOSIS — I10 Essential (primary) hypertension: Secondary | ICD-10-CM | POA: Diagnosis not present

## 2021-10-01 DIAGNOSIS — Z125 Encounter for screening for malignant neoplasm of prostate: Secondary | ICD-10-CM | POA: Diagnosis not present

## 2021-10-08 DIAGNOSIS — Z1339 Encounter for screening examination for other mental health and behavioral disorders: Secondary | ICD-10-CM | POA: Diagnosis not present

## 2021-10-08 DIAGNOSIS — I10 Essential (primary) hypertension: Secondary | ICD-10-CM | POA: Diagnosis not present

## 2021-10-08 DIAGNOSIS — R3129 Other microscopic hematuria: Secondary | ICD-10-CM | POA: Diagnosis not present

## 2021-10-08 DIAGNOSIS — Z1331 Encounter for screening for depression: Secondary | ICD-10-CM | POA: Diagnosis not present

## 2021-10-08 DIAGNOSIS — F17200 Nicotine dependence, unspecified, uncomplicated: Secondary | ICD-10-CM | POA: Diagnosis not present

## 2021-10-08 DIAGNOSIS — Z720 Tobacco use: Secondary | ICD-10-CM | POA: Diagnosis not present

## 2021-10-08 DIAGNOSIS — R12 Heartburn: Secondary | ICD-10-CM | POA: Diagnosis not present

## 2021-10-08 DIAGNOSIS — Z0000 Encounter for general adult medical examination without abnormal findings: Secondary | ICD-10-CM | POA: Diagnosis not present

## 2022-01-19 ENCOUNTER — Encounter: Payer: Self-pay | Admitting: Internal Medicine

## 2022-02-19 ENCOUNTER — Ambulatory Visit (AMBULATORY_SURGERY_CENTER): Payer: BC Managed Care – PPO

## 2022-02-19 ENCOUNTER — Other Ambulatory Visit: Payer: Self-pay

## 2022-02-19 VITALS — Ht 67.0 in | Wt 185.0 lb

## 2022-02-19 DIAGNOSIS — Z1211 Encounter for screening for malignant neoplasm of colon: Secondary | ICD-10-CM

## 2022-02-19 MED ORDER — PLENVU 140 G PO SOLR: 1.0000 | ORAL | 0 refills | Status: DC

## 2022-02-19 NOTE — Progress Notes (Signed)
?  Patient's pre-visit was done today over the phone with the patient  ? ?Name,DOB and address verified.  ?  ?Patient denies any allergies to Eggs and Soy.  ? ?Patient denies any problems with anesthesia/sedation-has only had sedation for colonoscopy ? ?Patient denies taking diet pills or blood thinners.  ? ?Denies atrial flutter or atrial fib ? ?Denies chronic constipation ? ?No home Oxygen.  ? ?Packet of Prep instructions sent by My Chart or mail to patient including a copy of a consent form if by mail-pt is aware.  ? ?Patient understands to call us back with any questions or  ?concerns.  ? ?Patient is aware of our care-partner policy and 0000000 safety protocol.  ? ? ?  ? ?  ?

## 2022-03-03 ENCOUNTER — Encounter: Payer: Self-pay | Admitting: Internal Medicine

## 2022-03-12 ENCOUNTER — Encounter: Payer: Self-pay | Admitting: Internal Medicine

## 2022-03-12 ENCOUNTER — Ambulatory Visit (AMBULATORY_SURGERY_CENTER): Payer: BC Managed Care – PPO | Admitting: Internal Medicine

## 2022-03-12 VITALS — BP 128/85 | HR 69 | Temp 98.0°F | Resp 13 | Ht 67.0 in | Wt 185.0 lb

## 2022-03-12 DIAGNOSIS — Z1211 Encounter for screening for malignant neoplasm of colon: Secondary | ICD-10-CM

## 2022-03-12 MED ORDER — SODIUM CHLORIDE 0.9 % IV SOLN: 500.0000 mL | Freq: Once | INTRAVENOUS | Status: DC

## 2022-03-12 NOTE — Progress Notes (Signed)
PT taken to PACU. Monitors in place. VSS. Report given to RN. 

## 2022-03-12 NOTE — Op Note (Signed)
[facility] ?Patient Name: Michael CarnesJonathan Gilmore ?Procedure Date: 03/12/2022 9:05 AM ?MRN: [id number] ?Endoscopist: Beverley FiedlerJay M Jolinda Pinkstaff , MD ?Age: [age] ?Referring MD:  ?Date of Birth: 11/11/1970 ?Gender: Male ?Account #: [id number] ?Procedure:                Colonoscopy ?Indications:              Screening for colorectal malignant neoplasm ?Medicines:                Monitored Anesthesia Care ?Procedure:                Pre-Anesthesia Assessment: ?                          - Prior to the procedure, a History and Physical  ?                          was performed, and patient medications and  ?                          allergies were reviewed. The patient's tolerance of  ?                          previous anesthesia was also reviewed. The risks  ?                          and benefits of the procedure and the sedation  ?                          options and risks were discussed with the patient.  ?                          All questions were answered, and informed consent  ?                          was obtained. Prior Anticoagulants: The patient has  ?                          taken no previous anticoagulant or antiplatelet  ?                          agents. ASA Grade Assessment: II - A patient with  ?                          mild systemic disease. After reviewing the risks  ?                          and benefits, the patient was deemed in  ?                          satisfactory condition to undergo the procedure. ?                          After obtaining informed consent, the colonoscope  ?  was passed under direct vision. Throughout the  ?                          procedure, the patient's blood pressure, pulse, and  ?                          oxygen saturations were monitored continuously. The  ?                          CF [id number] #[id number] was introduced through the anus  ?                          and advanced to the cecum, identified by  ?                          appendiceal orifice and  ileocecal valve. The  ?                          colonoscopy was performed without difficulty. The  ?                          patient tolerated the procedure well. The quality  ?                          of the bowel preparation was good. The ileocecal  ?                          valve, appendiceal orifice, and rectum were  ?                          photographed. ?Scope In: 9:19:22 AM ?Scope Out: 9:31:36 AM ?Scope Withdrawal Time: 0 hours 9 minutes 21 seconds  ?Total Procedure Duration: 0 hours 12 minutes 14 seconds  ?Findings:                 The digital rectal exam was normal. ?                          Multiple small and large-mouthed diverticula were  ?                          found in the sigmoid colon. ?                          Internal hemorrhoids were found during  ?                          retroflexion. The hemorrhoids were small. ?                          The exam was otherwise without abnormality. ?Complications:            No immediate complications. ?Estimated Blood Loss:     Estimated blood loss: none. ?Impression:               - Diverticulosis in the sigmoid colon. ?                          -  Small internal hemorrhoids. ?                          - The examination was otherwise normal. ?                          - No specimens collected. ?Recommendation:           - Patient has a contact number available for  ?                          emergencies. The signs and symptoms of potential  ?                          delayed complications were discussed with the  ?                          patient. Return to normal activities tomorrow.  ?                          Written discharge instructions were provided to the  ?                          patient. ?                          - Resume previous diet. ?                          - Continue present medications. ?                          - Await pathology results. ?                          - Repeat colonoscopy in 10 years for screening  ?                           purposes. ?Beverley Fiedler, MD ?03/12/2022 9:36:22 AM ?This report has been signed electronically. ?

## 2022-03-12 NOTE — Progress Notes (Signed)
Pt's states no medical or surgical changes since previsit or office visit. 

## 2022-03-12 NOTE — Patient Instructions (Signed)
YOU HAD AN ENDOSCOPIC PROCEDURE TODAY AT THE [facility]:   Refer to the procedure report that was given to you for any specific questions about what was found during the examination.  If the procedure report does not answer your questions, please call your gastroenterologist to clarify.  If you requested that your care partner not be given the details of your procedure findings, then the procedure report has been included in a sealed envelope for you to review at your convenience later. ° °YOU SHOULD EXPECT: Some feelings of bloating in the abdomen. Passage of more gas than usual.  Walking can help get rid of the air that was put into your GI tract during the procedure and reduce the bloating. If you had a lower endoscopy (such as a colonoscopy or flexible sigmoidoscopy) you may notice spotting of blood in your stool or on the toilet paper. If you underwent a bowel prep for your procedure, you may not have a normal bowel movement for a few days. ° °Please Note:  You might notice some irritation and congestion in your nose or some drainage.  This is from the oxygen used during your procedure.  There is no need for concern and it should clear up in a day or so. ° °SYMPTOMS TO REPORT IMMEDIATELY: ° °Following lower endoscopy (colonoscopy or flexible sigmoidoscopy): ° Excessive amounts of blood in the stool ° Significant tenderness or worsening of abdominal pains ° Swelling of the abdomen that is new, acute ° Fever of 100°F or higher ° °For urgent or emergent issues, a gastroenterologist can be reached at any hour by calling [phone]. °Do not use MyChart messaging for urgent concerns.  ° ° °DIET:  We do recommend a small meal at first, but then you may proceed to your regular diet.  Drink plenty of fluids but you should avoid alcoholic beverages for 24 hours. ° °ACTIVITY:  You should plan to take it easy for the rest of today and you should NOT DRIVE or use heavy machinery until tomorrow (because of  the sedation medicines used during the test).   ° °FOLLOW UP: °Our staff will call the number listed on your records 48-72 hours following your procedure to check on you and address any questions or concerns that you may have regarding the information given to you following your procedure. If we do not reach you, we will leave a message.  We will attempt to reach you two times.  During this call, we will ask if you have developed any symptoms of COVID 19. If you develop any symptoms (ie: fever, flu-like symptoms, shortness of breath, cough etc.) before then, please call [phone].  If you test positive for Covid 19 in the 2 weeks post procedure, please call and report this information to us.   ° °SIGNATURES/CONFIDENTIALITY: °You and/or your care partner have signed paperwork which will be entered into your electronic medical record.  These signatures attest to the fact that that the information above on your After Visit Summary has been reviewed and is understood.  Full responsibility of the confidentiality of this discharge information lies with you and/or your care-partner.  °

## 2022-03-12 NOTE — Progress Notes (Signed)
? ?GASTROENTEROLOGY PROCEDURE H&P NOTE  ? ?Primary Care Physician: ?Charlane Ferretti, DO ? ? ? ?Reason for Procedure:  Colon cancer screening ? ?Plan:    Colonoscopy ? ?Patient is appropriate for endoscopic procedure(s) in the ambulatory ([facility]) setting. ? ?The nature of the procedure, as well as the risks, benefits, and alternatives were carefully and thoroughly reviewed with the patient. Ample time for discussion and questions allowed. The patient understood, was satisfied, and agreed to proceed.  ? ? ? ?HPI: ?Michael Gilmore is a 51 y.o. male who presents for screening colonoscopy.  Medical history as below.  Tolerated the prep.  No recent chest pain or shortness of breath.  No abdominal pain today. ? ?Past Medical History:  ?Diagnosis Date  ? Allergy   ? Asthma   ? Colitis   ? GERD (gastroesophageal reflux disease)   ? Hypertension   ? Kidney stones   ? ? ?Past Surgical History:  ?Procedure Laterality Date  ? COLONOSCOPY    ? ? ?Prior to Admission medications   ?Medication Sig Start Date End Date Taking? Authorizing Provider  ?ibuprofen (ADVIL,MOTRIN) 800 MG tablet Take 0.5-1 tablets (400-800 mg total) by mouth 3 (three) times daily. Take with food. 05/28/16  Yes Ofilia Neas, PA-C  ?irbesartan (AVAPRO) 150 MG tablet Take 150 mg by mouth daily.   Yes [provider]  ?omeprazole (PRILOSEC) 40 MG capsule 1 capsule 30 minutes before morning meal for three weeks to see if helps with cough and stomach concerns Orally Once a day for 30 day(s) 09/18/20  Yes [provider]  ?Acetaminophen (TYLENOL PO) Take 2 tablets by mouth as needed (headache).    [provider]  ?albuterol (VENTOLIN HFA) 108 (90 Base) MCG/ACT inhaler Inhale into the lungs every 6 (six) hours as needed for wheezing or shortness of breath.    [provider]  ?loratadine (CLARITIN) 10 MG tablet 1 tablet Orally Once a day for 30 day(s) 10/08/21   [provider]  ?sodium chloride (OCEAN) 0.65 % nasal spray  as directed Nasally 10/08/21   [provider]  ? ? ?Current Outpatient Medications  ?Medication Sig Dispense Refill  ? ibuprofen (ADVIL,MOTRIN) 800 MG tablet Take 0.5-1 tablets (400-800 mg total) by mouth 3 (three) times daily. Take with food. 30 tablet 0  ? irbesartan (AVAPRO) 150 MG tablet Take 150 mg by mouth daily.    ? omeprazole (PRILOSEC) 40 MG capsule 1 capsule 30 minutes before morning meal for three weeks to see if helps with cough and stomach concerns Orally Once a day for 30 day(s)    ? Acetaminophen (TYLENOL PO) Take 2 tablets by mouth as needed (headache).    ? albuterol (VENTOLIN HFA) 108 (90 Base) MCG/ACT inhaler Inhale into the lungs every 6 (six) hours as needed for wheezing or shortness of breath.    ? loratadine (CLARITIN) 10 MG tablet 1 tablet Orally Once a day for 30 day(s)    ? sodium chloride (OCEAN) 0.65 % nasal spray as directed Nasally    ? ?Current Facility-Administered Medications  ?Medication Dose Route Frequency Provider Last Rate Last Admin  ? 0.9 %  sodium chloride infusion  500 mL Intravenous Once Priscila Bean, Carie Caddy, MD      ? ? ?Allergies as of 03/12/2022  ? (No Known Allergies)  ? ? ?Family History  ?Problem Relation Age of Onset  ? Thyroid nodules Mother   ? Hypertension Other   ?     Maternal uncles/Paternal uncles  ?  Colon cancer Neg Hx   ? Esophageal cancer Neg Hx   ? Rectal cancer Neg Hx   ? Stomach cancer Neg Hx   ? ? ?Social History  ? ?Socioeconomic History  ? Marital status: Divorced  ?  Spouse name: Not on file  ? Number of children: Not on file  ? Years of education: Not on file  ? Highest education level: Not on file  ?Occupational History  ? Occupation: Science writer  ?  Employer: [employer]  ?Tobacco Use  ? Smoking status: Every Day  ?  Packs/day: 1.00  ?  Types: Cigarettes  ? Smokeless tobacco: Never  ?Vaping Use  ? Vaping Use: Never used  ?Substance and Sexual Activity  ? Alcohol use: Yes  ?  Alcohol/week: 1.0 standard drink  ?  Types: 1  Cans of beer per week  ?  Comment: once weekly  ? Drug use: No  ? Sexual activity: Not on file  ?Other Topics Concern  ? Not on file  ?Social History Narrative  ? Not on file  ? ?Social Determinants of Health  ? ?Financial Resource Strain: Not on file  ?Food Insecurity: Not on file  ?Transportation Needs: Not on file  ?Physical Activity: Not on file  ?Stress: Not on file  ?Social Connections: Not on file  ?Intimate Partner Violence: Not on file  ? ? ?Physical Exam: ?Vital signs in last 24 hours: ?@BP  (!) 124/91   Pulse 81   Temp 98 ?F (36.7 ?C) (Temporal)   Ht 5\' 7"  (1.702 m)   Wt 185 lb (83.9 kg)   SpO2 99%   BMI 28.98 kg/m?  ?GEN: NAD ?EYE: Sclerae anicteric ?ENT: MMM ?CV: Non-tachycardic ?Pulm: CTA b/l ?GI: Soft, NT/ND ?NEURO:  Alert & Oriented x 3 ? ? ? , MD ?[facility] ? ?03/12/2022 9:11 AM ? ?

## 2022-03-16 ENCOUNTER — Telehealth: Payer: Self-pay

## 2022-03-16 ENCOUNTER — Telehealth: Payer: Self-pay | Admitting: *Deleted

## 2022-03-16 NOTE — Telephone Encounter (Signed)
Unable to reach pt

## 2022-03-16 NOTE — Telephone Encounter (Signed)
?  Follow up Call- ? ? ?  03/12/2022  ?  8:20 AM  ?Call back number  ?Post procedure Call Back phone  # [phone]  ?Permission to leave phone message Yes  ?  ? ?First attempt for follow up phone call. No answer at number given.  Left message on voicemail.   ? ? ?

## 2022-03-26 DIAGNOSIS — L565 Disseminated superficial actinic porokeratosis (DSAP): Secondary | ICD-10-CM | POA: Diagnosis not present

## 2022-03-26 DIAGNOSIS — L600 Ingrowing nail: Secondary | ICD-10-CM | POA: Diagnosis not present

## 2022-04-16 DIAGNOSIS — Z0000 Encounter for general adult medical examination without abnormal findings: Secondary | ICD-10-CM | POA: Diagnosis not present

## 2022-04-16 DIAGNOSIS — Z1339 Encounter for screening examination for other mental health and behavioral disorders: Secondary | ICD-10-CM | POA: Diagnosis not present

## 2022-04-16 DIAGNOSIS — I10 Essential (primary) hypertension: Secondary | ICD-10-CM | POA: Diagnosis not present

## 2022-04-16 DIAGNOSIS — Z1331 Encounter for screening for depression: Secondary | ICD-10-CM | POA: Diagnosis not present

## 2022-04-29 DIAGNOSIS — L03031 Cellulitis of right toe: Secondary | ICD-10-CM | POA: Diagnosis not present

## 2022-04-29 DIAGNOSIS — M76822 Posterior tibial tendinitis, left leg: Secondary | ICD-10-CM | POA: Diagnosis not present

## 2022-05-20 DIAGNOSIS — D2372 Other benign neoplasm of skin of left lower limb, including hip: Secondary | ICD-10-CM | POA: Diagnosis not present

## 2022-10-15 DIAGNOSIS — I10 Essential (primary) hypertension: Secondary | ICD-10-CM | POA: Diagnosis not present

## 2022-10-15 DIAGNOSIS — Z125 Encounter for screening for malignant neoplasm of prostate: Secondary | ICD-10-CM | POA: Diagnosis not present

## 2022-10-22 DIAGNOSIS — Z0000 Encounter for general adult medical examination without abnormal findings: Secondary | ICD-10-CM | POA: Diagnosis not present

## 2022-10-22 DIAGNOSIS — R82998 Other abnormal findings in urine: Secondary | ICD-10-CM | POA: Diagnosis not present

## 2022-10-22 DIAGNOSIS — Z1389 Encounter for screening for other disorder: Secondary | ICD-10-CM | POA: Diagnosis not present

## 2022-10-22 DIAGNOSIS — I10 Essential (primary) hypertension: Secondary | ICD-10-CM | POA: Diagnosis not present

## 2022-10-22 DIAGNOSIS — Z1331 Encounter for screening for depression: Secondary | ICD-10-CM | POA: Diagnosis not present

## 2023-04-26 DIAGNOSIS — I10 Essential (primary) hypertension: Secondary | ICD-10-CM | POA: Diagnosis not present

## 2023-04-26 DIAGNOSIS — R7301 Impaired fasting glucose: Secondary | ICD-10-CM | POA: Diagnosis not present

## 2023-10-18 DIAGNOSIS — R7989 Other specified abnormal findings of blood chemistry: Secondary | ICD-10-CM | POA: Diagnosis not present

## 2023-10-18 DIAGNOSIS — R7301 Impaired fasting glucose: Secondary | ICD-10-CM | POA: Diagnosis not present

## 2023-10-18 DIAGNOSIS — Z125 Encounter for screening for malignant neoplasm of prostate: Secondary | ICD-10-CM | POA: Diagnosis not present

## 2023-11-11 DIAGNOSIS — Z23 Encounter for immunization: Secondary | ICD-10-CM | POA: Diagnosis not present

## 2023-11-11 DIAGNOSIS — Z1339 Encounter for screening examination for other mental health and behavioral disorders: Secondary | ICD-10-CM | POA: Diagnosis not present

## 2023-11-11 DIAGNOSIS — Z1331 Encounter for screening for depression: Secondary | ICD-10-CM | POA: Diagnosis not present

## 2023-11-11 DIAGNOSIS — Z0000 Encounter for general adult medical examination without abnormal findings: Secondary | ICD-10-CM | POA: Diagnosis not present

## 2023-11-11 DIAGNOSIS — I10 Essential (primary) hypertension: Secondary | ICD-10-CM | POA: Diagnosis not present

## 2023-12-06 DIAGNOSIS — R0683 Snoring: Secondary | ICD-10-CM | POA: Diagnosis not present

## 2023-12-26 DIAGNOSIS — G4733 Obstructive sleep apnea (adult) (pediatric): Secondary | ICD-10-CM | POA: Diagnosis not present

## 2023-12-28 DIAGNOSIS — G4734 Idiopathic sleep related nonobstructive alveolar hypoventilation: Secondary | ICD-10-CM | POA: Diagnosis not present

## 2023-12-28 DIAGNOSIS — G4733 Obstructive sleep apnea (adult) (pediatric): Secondary | ICD-10-CM | POA: Diagnosis not present

## 2024-01-12 DIAGNOSIS — G4733 Obstructive sleep apnea (adult) (pediatric): Secondary | ICD-10-CM | POA: Diagnosis not present

## 2024-03-13 DIAGNOSIS — G4733 Obstructive sleep apnea (adult) (pediatric): Secondary | ICD-10-CM | POA: Diagnosis not present

## 2024-03-29 DIAGNOSIS — G4733 Obstructive sleep apnea (adult) (pediatric): Secondary | ICD-10-CM | POA: Diagnosis not present

## 2024-04-13 DIAGNOSIS — G4733 Obstructive sleep apnea (adult) (pediatric): Secondary | ICD-10-CM | POA: Diagnosis not present

## 2024-05-03 DIAGNOSIS — G4733 Obstructive sleep apnea (adult) (pediatric): Secondary | ICD-10-CM | POA: Diagnosis not present

## 2024-11-20 ENCOUNTER — Other Ambulatory Visit: Payer: Self-pay | Admitting: Internal Medicine

## 2024-11-20 DIAGNOSIS — R1011 Right upper quadrant pain: Secondary | ICD-10-CM

## 2024-12-04 ENCOUNTER — Ambulatory Visit
Admission: RE | Admit: 2024-12-04 | Discharge: 2024-12-04 | Disposition: A | Source: Ambulatory Visit | Attending: Internal Medicine

## 2024-12-04 DIAGNOSIS — R1011 Right upper quadrant pain: Secondary | ICD-10-CM
